# Patient Record
Sex: Female | Born: 1965
Health system: Southern US, Community
[De-identification: ages and names within clinical notes are randomized; demographics above are authoritative.]

## PROBLEM LIST (undated history)

## (undated) DIAGNOSIS — D649 Anemia, unspecified: Secondary | ICD-10-CM

## (undated) DIAGNOSIS — N8003 Adenomyosis of the uterus: Secondary | ICD-10-CM

## (undated) DIAGNOSIS — F32A Depression, unspecified: Secondary | ICD-10-CM

## (undated) DIAGNOSIS — D500 Iron deficiency anemia secondary to blood loss (chronic): Secondary | ICD-10-CM

## (undated) DIAGNOSIS — N946 Dysmenorrhea, unspecified: Secondary | ICD-10-CM

## (undated) DIAGNOSIS — I4891 Unspecified atrial fibrillation: Secondary | ICD-10-CM

## (undated) DIAGNOSIS — Z5189 Encounter for other specified aftercare: Secondary | ICD-10-CM

## (undated) DIAGNOSIS — F411 Generalized anxiety disorder: Secondary | ICD-10-CM

## (undated) DIAGNOSIS — F419 Anxiety disorder, unspecified: Secondary | ICD-10-CM

## (undated) DIAGNOSIS — N921 Excessive and frequent menstruation with irregular cycle: Secondary | ICD-10-CM

## (undated) DIAGNOSIS — M1990 Unspecified osteoarthritis, unspecified site: Secondary | ICD-10-CM

## (undated) DIAGNOSIS — I10 Essential (primary) hypertension: Secondary | ICD-10-CM

## (undated) DIAGNOSIS — F329 Major depressive disorder, single episode, unspecified: Secondary | ICD-10-CM

## (undated) DIAGNOSIS — K219 Gastro-esophageal reflux disease without esophagitis: Secondary | ICD-10-CM

## (undated) DIAGNOSIS — Z973 Presence of spectacles and contact lenses: Secondary | ICD-10-CM

## (undated) DIAGNOSIS — G5139 Clonic hemifacial spasm, unspecified: Secondary | ICD-10-CM

## (undated) HISTORY — DX: Depression, unspecified: F32.A

## (undated) HISTORY — DX: Encounter for other specified aftercare: Z51.89

## (undated) HISTORY — PX: OTHER SURGICAL HISTORY: SHX169

## (undated) HISTORY — DX: Unspecified osteoarthritis, unspecified site: M19.90

## (undated) HISTORY — DX: Anxiety disorder, unspecified: F41.9

## (undated) HISTORY — DX: Essential (primary) hypertension: I10

---

## 2004-02-06 HISTORY — PX: DILATION AND EVACUATION: SHX1459

## 2008-02-06 DIAGNOSIS — Z8679 Personal history of other diseases of the circulatory system: Secondary | ICD-10-CM

## 2008-02-06 HISTORY — DX: Personal history of other diseases of the circulatory system: Z86.79

## 2011-06-06 ENCOUNTER — Emergency Department (HOSPITAL_COMMUNITY): Payer: 59

## 2011-06-06 ENCOUNTER — Emergency Department (HOSPITAL_COMMUNITY)
Admission: EM | Admit: 2011-06-06 | Discharge: 2011-06-06 | Disposition: A | Discharge status: Home / Self Care | Payer: 59 | Attending: Emergency Medicine | Admitting: Emergency Medicine

## 2011-06-06 ENCOUNTER — Encounter (HOSPITAL_COMMUNITY): Payer: Self-pay

## 2011-06-06 DIAGNOSIS — R319 Hematuria, unspecified: Secondary | ICD-10-CM | POA: Insufficient documentation

## 2011-06-06 DIAGNOSIS — N23 Unspecified renal colic: Secondary | ICD-10-CM | POA: Insufficient documentation

## 2011-06-06 DIAGNOSIS — R109 Unspecified abdominal pain: Secondary | ICD-10-CM | POA: Insufficient documentation

## 2011-06-06 HISTORY — DX: Unspecified atrial fibrillation: I48.91

## 2011-06-06 LAB — COMPREHENSIVE METABOLIC PANEL
AST: 12 U/L (ref 0–37)
Albumin: 3.8 g/dL (ref 3.5–5.2)
Alkaline Phosphatase: 65 U/L (ref 39–117)
Chloride: 106 mEq/L (ref 96–112)
Potassium: 3.8 mEq/L (ref 3.5–5.1)
Sodium: 137 mEq/L (ref 135–145)
Total Bilirubin: 0.2 mg/dL — ABNORMAL LOW (ref 0.3–1.2)
Total Protein: 6.2 g/dL (ref 6.0–8.3)

## 2011-06-06 LAB — URINE MICROSCOPIC-ADD ON

## 2011-06-06 LAB — URINALYSIS, ROUTINE W REFLEX MICROSCOPIC
Glucose, UA: NEGATIVE mg/dL
Hgb urine dipstick: NEGATIVE
Protein, ur: NEGATIVE mg/dL
pH: 5 (ref 5.0–8.0)

## 2011-06-06 LAB — DIFFERENTIAL
Basophils Absolute: 0 10*3/uL (ref 0.0–0.1)
Basophils Relative: 0 % (ref 0–1)
Eosinophils Absolute: 0.1 10*3/uL (ref 0.0–0.7)
Neutro Abs: 5.1 10*3/uL (ref 1.7–7.7)
Neutrophils Relative %: 60 % (ref 43–77)

## 2011-06-06 LAB — CBC
Hemoglobin: 13.7 g/dL (ref 12.0–15.0)
MCH: 29.4 pg (ref 26.0–34.0)
MCHC: 34 g/dL (ref 30.0–36.0)
Platelets: 199 10*3/uL (ref 150–400)
RDW: 13.2 % (ref 11.5–15.5)

## 2011-06-06 LAB — POCT PREGNANCY, URINE: Preg Test, Ur: NEGATIVE

## 2011-06-06 MED ORDER — SODIUM CHLORIDE 0.9 % IV BOLUS (SEPSIS)
1000.0000 mL | Freq: Once | INTRAVENOUS | Status: AC
  Administered 2011-06-06: 1000 mL via INTRAVENOUS

## 2011-06-06 MED ORDER — OXYCODONE-ACETAMINOPHEN 5-325 MG PO TABS: 1.0000 | ORAL_TABLET | ORAL | Status: AC | PRN

## 2011-06-06 MED ORDER — PHENAZOPYRIDINE HCL 200 MG PO TABS: 200.0000 mg | ORAL_TABLET | Freq: Three times a day (TID) | ORAL | Status: AC

## 2011-06-06 MED ORDER — SULFAMETHOXAZOLE-TRIMETHOPRIM 800-160 MG PO TABS: 1.0000 | ORAL_TABLET | Freq: Two times a day (BID) | ORAL | Status: AC

## 2011-06-06 MED ORDER — CEFTRIAXONE SODIUM 1 G IJ SOLR
1.0000 g | Freq: Once | INTRAMUSCULAR | Status: AC
  Administered 2011-06-06: 1 g via INTRAVENOUS
  Filled 2011-06-06: qty 10

## 2011-06-06 MED ORDER — TAMSULOSIN HCL 0.4 MG PO CAPS: 0.4000 mg | ORAL_CAPSULE | Freq: Every day | ORAL | Status: DC

## 2011-06-06 MED ORDER — KETOROLAC TROMETHAMINE 30 MG/ML IJ SOLN
30.0000 mg | Freq: Once | INTRAMUSCULAR | Status: AC
  Administered 2011-06-06: 30 mg via INTRAVENOUS
  Filled 2011-06-06: qty 1

## 2011-06-06 MED ORDER — MORPHINE SULFATE 4 MG/ML IJ SOLN
4.0000 mg | Freq: Once | INTRAMUSCULAR | Status: AC
  Administered 2011-06-06: 4 mg via INTRAVENOUS
  Filled 2011-06-06: qty 1

## 2011-06-06 MED ORDER — ONDANSETRON HCL 4 MG PO TABS: 4.0000 mg | ORAL_TABLET | Freq: Four times a day (QID) | ORAL | Status: AC

## 2011-06-06 NOTE — ED Notes (Signed)
MD at bedside. 

## 2011-06-06 NOTE — ED Notes (Signed)
Patient transported to CT 

## 2011-06-06 NOTE — ED Provider Notes (Signed)
History     CSN: [id number]  Arrival date & time 06/06/11  1713   First MD Initiated Contact with Patient 06/06/11 1817      Chief Complaint  Patient presents with  . Flank Pain    (Consider location/radiation/quality/duration/timing/severity/associated sxs/prior treatment) HPI Pt with L flank pain radiating to L groin this morning with nausea, hematuria and difficulty initiating urine stream. No fever chills. +dysuria. Pain has improved significantly. + family history of renal stones Past Medical History  Diagnosis Date  . Atrial fibrillation     Past Surgical History  Procedure Date  . Mscarrigage   . Cesarean section     No family history on file.  History  Substance Use Topics  . Smoking status: Never Smoker   . Smokeless tobacco: Not on file  . Alcohol Use: No    OB History    Grav Para Term Preterm Abortions TAB SAB Ect Mult Living                  Review of Systems  Constitutional: Negative for fever and chills.  Gastrointestinal: Positive for nausea and abdominal pain. Negative for vomiting.  Genitourinary: Positive for dysuria, hematuria, flank pain and difficulty urinating.    Allergies  Review of patient's allergies indicates no known allergies.  Home Medications   Current Outpatient Rx  Name Route Sig Dispense Refill  . IBUPROFEN 200 MG PO TABS Oral Take 600 mg by mouth every 6 (six) hours as needed. For pain    . ONDANSETRON HCL 4 MG PO TABS Oral Take 1 tablet (4 mg total) by mouth every 6 (six) hours. 12 tablet 0  . OXYCODONE-ACETAMINOPHEN 5-325 MG PO TABS Oral Take 1 tablet by mouth every 4 (four) hours as needed for pain. 15 tablet 0  . PHENAZOPYRIDINE HCL 200 MG PO TABS Oral Take 200 mg by mouth 3 (three) times daily as needed.    . SULFAMETHOXAZOLE-TRIMETHOPRIM 800-160 MG PO TABS Oral Take 1 tablet by mouth 2 (two) times daily. 14 tablet 0  . TAMSULOSIN HCL 0.4 MG PO CAPS Oral Take 1 capsule (0.4 mg total) by mouth daily. 30 capsule 0     BP 113/68  Pulse 78  Temp(Src) 97.9 F (36.6 C) (Oral)  Resp 16  SpO2 96%  LMP 05/19/2011  Physical Exam  Nursing note and vitals reviewed. Constitutional: She is oriented to person, place, and time. She appears well-developed and well-nourished. No distress.  HENT:  Head: Normocephalic and atraumatic.  Mouth/Throat: Oropharynx is clear and moist.  Eyes: EOM are normal. Pupils are equal, round, and reactive to light.  Neck: Normal range of motion. Neck supple.  Cardiovascular: Normal rate and regular rhythm.   Pulmonary/Chest: Effort normal and breath sounds normal. No respiratory distress. She has no wheezes. She has no rales. She exhibits no tenderness.  Abdominal: Soft. Bowel sounds are normal. There is no tenderness. There is no rebound and no guarding.  Musculoskeletal: Normal range of motion. She exhibits no edema and no tenderness.  Neurological: She is alert and oriented to person, place, and time.  Skin: Skin is warm and dry. No rash noted. No erythema.  Psychiatric: She has a normal mood and affect. Her behavior is normal.    ED Course  Procedures (including critical care time)  Labs Reviewed  URINALYSIS, ROUTINE W REFLEX MICROSCOPIC - Abnormal; Notable for the following:    Color, Urine RED (*) BIOCHEMICALS MAY BE AFFECTED BY COLOR   Bilirubin Urine MODERATE (*)  Ketones, ur 15 (*)    Urobilinogen, UA 4.0 (*)    Nitrite POSITIVE (*)    Leukocytes, UA MODERATE (*)    All other components within normal limits  COMPREHENSIVE METABOLIC PANEL - Abnormal; Notable for the following:    Total Bilirubin 0.2 (*)    All other components within normal limits  POCT PREGNANCY, URINE  URINE MICROSCOPIC-ADD ON  CBC  DIFFERENTIAL  URINE CULTURE   Ct Abdomen Pelvis Wo Contrast  06/06/2011  *RADIOLOGY REPORT*  Clinical Data:  left flank pain  CT ABDOMEN AND PELVIS WITHOUT CONTRAST  Technique:  Multidetector CT imaging of the abdomen and pelvis was performed following the  standard protocol without intravenous contrast.  Comparison: None.  Findings: Lung bases are unremarkable.  Sagittal images of the spine are unremarkable.  Unenhanced liver is unremarkable.  No intrahepatic biliary ductal dilatation.  Unenhanced spleen, pancreas and left adrenal gland are unremarkable.  No calcified gallstones are noted within gallbladder. Small calcification is noted in the right adrenal gland probable prior hemorrhage.  No nephrolithiasis.  There is minimal prominence of the left ureter.  No proximal or mid calcified ureteral calculi are noted.  In axial image 70 there is a 4 mm calcification in the left UVJ region highly suspicious for a distal left ureteral calculus.  No significant ureteral dilatation is noted.  Limited assessment of the unenhanced uterus.  There is somewhat nodular prominence of the anterior fundal aspect on the sagittal view measures about 3.7 cm. I cannot exclude a myometrial fibroid.  Correlation with pelvic ultrasound is recommended.  No small bowel obstruction.  No ascites or free air.  No adenopathy.  Normal appendix is clearly visualized in axial image 50.  IMPRESSION:  1.  There is minimal prominence of the left ureter.  There is a 4 mm calcification in the left UVJ region highly suspicious for a distal left ureteral calculus. 2.  Limited assessment of the unenhanced uterus.  Somewhat nodular prominence of the fundus.  I cannot exclude a fundal uterine fibroid.  Correlation with pelvic ultrasound and GYN exam is recommended. 3.  Normal appendix is clearly visualized.  Original Report Authenticated By: Natasha Mead, M.D.     1. Renal colic on left side       MDM    Discussed with urology, Dr Laverle Patter. Suggest starting empiric abx and f/u as outpt. Return immediately for fever, worsening pain, uncontrolled vomiting or any concerns      Loren Racer, MD 06/06/11 2157

## 2011-06-06 NOTE — Discharge Instructions (Signed)

## 2011-06-06 NOTE — ED Notes (Signed)
Patient transported from CT 

## 2011-06-06 NOTE — ED Notes (Signed)
Pt. began with lt. Flank pain , and pain became very  Severe with nausea,  Pt. Thinks she may have had a kidney stone, has never had one before but her family has a hx of them  AFter her pain stopped she developed severe pressure when voiding and hematuria, she did start taking pyridium .  Pt. Does have a hx of UTIs

## 2011-06-08 LAB — URINE CULTURE

## 2011-10-25 ENCOUNTER — Encounter (HOSPITAL_COMMUNITY): Payer: Self-pay | Admitting: *Deleted

## 2011-10-25 ENCOUNTER — Emergency Department (INDEPENDENT_AMBULATORY_CARE_PROVIDER_SITE_OTHER)
Admission: EM | Admit: 2011-10-25 | Discharge: 2011-10-25 | Disposition: A | Discharge status: Home / Self Care | Payer: 59 | Source: Home / Self Care | Attending: Family Medicine | Admitting: Family Medicine

## 2011-10-25 DIAGNOSIS — L0291 Cutaneous abscess, unspecified: Secondary | ICD-10-CM

## 2011-10-25 DIAGNOSIS — L0390 Cellulitis, unspecified: Secondary | ICD-10-CM

## 2011-10-25 DIAGNOSIS — T148 Other injury of unspecified body region: Secondary | ICD-10-CM

## 2011-10-25 DIAGNOSIS — W57XXXA Bitten or stung by nonvenomous insect and other nonvenomous arthropods, initial encounter: Secondary | ICD-10-CM

## 2011-10-25 DIAGNOSIS — T148XXA Other injury of unspecified body region, initial encounter: Secondary | ICD-10-CM

## 2011-10-25 MED ORDER — PREDNISONE 20 MG PO TABS: ORAL_TABLET | ORAL | Status: DC

## 2011-10-25 MED ORDER — CEPHALEXIN 500 MG PO CAPS: 500.0000 mg | ORAL_CAPSULE | Freq: Three times a day (TID) | ORAL | Status: DC

## 2011-10-25 NOTE — ED Notes (Signed)
Pt reports insect bite that occurred a few days ago, bruise like area is spreading, area is warm to touch

## 2011-10-25 NOTE — ED Provider Notes (Signed)
History     CSN: [id number]  Arrival date & time 10/25/11  1612   First MD Initiated Contact with Patient 10/25/11 1625      Chief Complaint  Patient presents with  . Insect Bite    (Consider location/radiation/quality/duration/timing/severity/associated sxs/prior treatment) HPI Comments:  46 year old nondiabetic female. Here complaining of an area of bruising/redness and tenderness in left arm lateral to left elbow after an insect bite that occurred 3 days ago. Skin discoloration appears to be spreading and patient reports experiencing mild discomfort with no restriction of movement in her left elbow and left shoulder joints. Denies fever or chills or general malaise. No headaches.    Past Medical History  Diagnosis Date  . Atrial fibrillation     Past Surgical History  Procedure Date  . Mscarrigage   . Cesarean section     Family History  Problem Relation Age of Onset  . Family history unknown: Yes    History  Substance Use Topics  . Smoking status: Never Smoker   . Smokeless tobacco: Not on file  . Alcohol Use: No    OB History    Grav Para Term Preterm Abortions TAB SAB Ect Mult Living                  Review of Systems  Constitutional: Negative for fever, chills and fatigue.       10 systems reviewed and  pertinent negative and positive symptoms are as per HPI.     Respiratory: Negative for cough, chest tightness, shortness of breath, wheezing and stridor.   Gastrointestinal: Negative for nausea, vomiting and abdominal pain.  Musculoskeletal: Positive for arthralgias. Negative for joint swelling.  Skin: Positive for rash.       As per history of present illness  Neurological: Negative for dizziness and headaches.  All other systems reviewed and are negative.    Allergies  Review of patient's allergies indicates no known allergies.  Home Medications   Current Outpatient Rx  Name Route Sig Dispense Refill  . CEPHALEXIN 500 MG PO CAPS Oral Take  1 capsule (500 mg total) by mouth 3 (three) times daily. 21 capsule 0    Okay to switch to equivalent liquid form if needed ...  . PREDNISONE 20 MG PO TABS  2 tabs by mouth daily for 5 days 10 tablet 0    Okay to switch to equivalent liquid form if possib ...    BP 121/75  Pulse 80  Temp 98.1 F (36.7 C) (Oral)  Resp 14  SpO2 98%  LMP 10/11/2011  Physical Exam  Nursing note and vitals reviewed. Constitutional: She is oriented to person, place, and time. She appears well-developed and well-nourished. No distress.  HENT:  Head: Normocephalic and atraumatic.  Mouth/Throat: Oropharynx is clear and moist.  Eyes: Conjunctivae normal are normal.  Cardiovascular: Normal rate, regular rhythm and normal heart sounds.   Pulmonary/Chest: Breath sounds normal. She has no wheezes.  Musculoskeletal:       Left elbow with no obvious swelling or increased temperature. No erythema. Full range of motion. Reported minimal discomfort with range of motion. Left shoulder. No redness or swelling. Full range of motion. Entire left upper extremity appears neurovascularly intact.  Lymphadenopathy:    She has no cervical adenopathy.  Neurological: She is alert and oriented to person, place, and time.  Skin:       There is an area of increased skin pigmentation in a concentric pattern of about 4 cm in  diameter. With central subcutaneous papule. Located close to left elbow lateral epicondyle. No skin swelling. Area is mildly tender to palpation. No increased temperature.    ED Course  Procedures (including critical care time)  Labs Reviewed - No data to display No results found.   1. Insect bite   2. Cellulitis       MDM  Immune reaction versus cellulitis as a consequence of the insect bite. Prescribe prednisone and cephalexin. Supportive care discussed with patient asked to return in 48-72 hours for recheck or return earlier if worsening symptoms despite following treatment.        Sharin Grave, MD 10/26/11 1210

## 2018-12-16 ENCOUNTER — Other Ambulatory Visit: Payer: Self-pay

## 2018-12-16 ENCOUNTER — Encounter (HOSPITAL_COMMUNITY): Payer: Self-pay

## 2018-12-16 ENCOUNTER — Ambulatory Visit (HOSPITAL_COMMUNITY)
Admission: EM | Admit: 2018-12-16 | Discharge: 2018-12-16 | Disposition: A | Discharge status: Home / Self Care | Payer: 59 | Attending: Family Medicine | Admitting: Family Medicine

## 2018-12-16 DIAGNOSIS — S91311A Laceration without foreign body, right foot, initial encounter: Secondary | ICD-10-CM | POA: Diagnosis not present

## 2018-12-16 DIAGNOSIS — W228XXA Striking against or struck by other objects, initial encounter: Secondary | ICD-10-CM | POA: Diagnosis not present

## 2018-12-16 NOTE — Discharge Instructions (Addendum)
Keep area clean and reasonably dry.  Watch for infection.  Return as needed

## 2018-12-16 NOTE — ED Triage Notes (Signed)
Patient presents to Urgent Care with complaints of right foot laceration since this morning. Patient reports she stepped on one of her daughter's arrowheads (the razor blade kind).  Pt reports tetanus approx 5 years ago.

## 2018-12-16 NOTE — ED Provider Notes (Signed)
[facility]    CSN: [id number] Arrival date & time: 12/16/18  0945      History   Chief Complaint Chief Complaint  Patient presents with  . Extremity Laceration    HPI Aimee Maldonado is a 53 y.o. female.   HPI  Patient states her daughter does Community education officer.  She is considering both hunting.  She dropped one of her arrowhead's that has razors around the edge.  Mother accidentally stepped on this this morning.  Has a laceration on her right foot.  Bleeding stopped with pressure.  Past Medical History:  Diagnosis Date  . Atrial fibrillation     There are no active problems to display for this patient.   Past Surgical History:  Procedure Laterality Date  . CESAREAN SECTION    . mscarrigage      OB History   No obstetric history on file.      Home Medications    Prior to Admission medications   Medication Sig Start Date End Date Taking? Authorizing Provider  buPROPion (WELLBUTRIN) 100 MG tablet Take 100 mg by mouth daily.   Yes [provider]  escitalopram (LEXAPRO) 20 MG tablet Take 20 mg by mouth daily.   Yes [provider]  cephALEXin (KEFLEX) 500 MG capsule Take 1 capsule (500 mg total) by mouth 3 (three) times daily. 10/25/11   Moreno-Coll, Adlih, MD  predniSONE (DELTASONE) 20 MG tablet 2 tabs by mouth daily for 5 days 10/25/11   Moreno-Coll, Adlih, MD    Family History Family History  Problem Relation Age of Onset  . Diabetes Mother   . Hypertension Mother   . Diabetes Father     Social History Social History   Tobacco Use  . Smoking status: Never Smoker  . Smokeless tobacco: Never Used  Substance Use Topics  . Alcohol use: No  . Drug use: No     Allergies   Patient has no known allergies.   Review of Systems Review of Systems  Constitutional: Negative for chills and fever.  HENT: Negative for ear pain and sore throat.   Eyes: Negative for pain and visual disturbance.  Respiratory: Negative for cough and  shortness of breath.   Cardiovascular: Negative for chest pain and palpitations.  Gastrointestinal: Negative for abdominal pain and vomiting.  Genitourinary: Negative for dysuria and hematuria.  Musculoskeletal: Negative for arthralgias and back pain.  Skin: Positive for wound. Negative for color change and rash.  Neurological: Negative for seizures and syncope.  All other systems reviewed and are negative.    Physical Exam Triage Vital Signs ED Triage Vitals  Enc Vitals Group     BP 12/16/18 1041 (!) 155/80     Pulse Rate 12/16/18 1041 87     Resp 12/16/18 1041 15     Temp 12/16/18 1041 98.1 F (36.7 C)     Temp Source 12/16/18 1041 Oral     SpO2 12/16/18 1041 100 %     Weight --      Height --      Head Circumference --      Peak Flow --      Pain Score 12/16/18 1037 1     Pain Loc --      Pain Edu? --      Excl. in [place]? --    No data found.  Updated Vital Signs BP (!) 155/80 (BP Location: Right Arm)   Pulse 87   Temp 98.1 F (36.7 C) (Oral)  Resp 15   SpO2 100%      Physical Exam Constitutional:      General: She is not in acute distress.    Appearance: She is well-developed and normal weight.  HENT:     Head: Normocephalic and atraumatic.  Eyes:     Conjunctiva/sclera: Conjunctivae normal.     Pupils: Pupils are equal, round, and reactive to light.  Neck:     Musculoskeletal: Normal range of motion.  Cardiovascular:     Rate and Rhythm: Normal rate.  Pulmonary:     Effort: Pulmonary effort is normal. No respiratory distress.  Abdominal:     General: There is no distension.     Palpations: Abdomen is soft.  Musculoskeletal: Normal range of motion.  Skin:    General: Skin is warm and dry.     Comments: His laceration on the medial aspect of the right foot over the first MTP joint.  3 cm long, 2 to 3 mm deep.  Bleeding controlled with pressure.  Closed with Dermabond  Neurological:     General: No focal deficit present.     Mental Status: She is  alert.  Psychiatric:        Mood and Affect: Mood normal.     Comments: Pleasant.  No distress.  Cooperative      UC Treatments / Results  Labs (all labs ordered are listed, but only abnormal results are displayed) Labs Reviewed - No data to display  EKG   Radiology No results found.  Procedures Procedures (including critical care time)  Medications Ordered in UC Medications - No data to display  Initial Impression / Assessment and Plan / UC Course  I have reviewed the triage vital signs and the nursing notes.  Pertinent labs & imaging results that were available during my care of the patient were reviewed by me and considered in my medical decision making (see chart for details).     Wound care reviewed Final Clinical Impressions(s) / UC Diagnoses   Final diagnoses:  None     Discharge Instructions     Keep area clean and reasonably dry.  Watch for infection.  Return as needed   ED Prescriptions    None     PDMP not reviewed this encounter.   Eustace Moore, MD 12/16/18 1131

## 2019-06-02 ENCOUNTER — Other Ambulatory Visit: Payer: Self-pay | Admitting: Family Medicine

## 2019-06-02 DIAGNOSIS — Z1231 Encounter for screening mammogram for malignant neoplasm of breast: Secondary | ICD-10-CM

## 2019-06-05 ENCOUNTER — Ambulatory Visit: Payer: 59

## 2020-01-13 ENCOUNTER — Ambulatory Visit: Payer: 59 | Admitting: Physician Assistant

## 2020-01-15 ENCOUNTER — Ambulatory Visit: Payer: 59 | Admitting: Physician Assistant

## 2020-01-19 ENCOUNTER — Other Ambulatory Visit: Payer: Self-pay

## 2020-01-19 ENCOUNTER — Encounter: Payer: Self-pay | Admitting: Physician Assistant

## 2020-01-19 ENCOUNTER — Ambulatory Visit (INDEPENDENT_AMBULATORY_CARE_PROVIDER_SITE_OTHER): Payer: 59 | Admitting: Physician Assistant

## 2020-01-19 VITALS — BP 142/92 | HR 113 | Temp 98.4°F | Resp 14 | Ht 61.0 in | Wt 144.0 lb

## 2020-01-19 DIAGNOSIS — I10 Essential (primary) hypertension: Secondary | ICD-10-CM | POA: Diagnosis not present

## 2020-01-19 DIAGNOSIS — Z8679 Personal history of other diseases of the circulatory system: Secondary | ICD-10-CM | POA: Diagnosis not present

## 2020-01-19 MED ORDER — METOPROLOL SUCCINATE ER 25 MG PO TB24: 25.0000 mg | ORAL_TABLET | Freq: Every day | ORAL | 3 refills | Status: DC

## 2020-01-19 NOTE — Patient Instructions (Signed)
Please go to the lab today for blood work.  I will call you with your results. We will alter treatment regimen(s) if indicated by your results.   Please start the Toprol XL daily. I am setting you up with Cardiology for stress testing giving history of a fib and EKG today.   Follow low-salt diet noted below. Check BP at home and record daily. Bring to follow-up in 2 weeks for further adjustments.   I am setting you up with GYN.

## 2020-01-19 NOTE — Progress Notes (Signed)
Patient presents to clinic today to establish care. Was recently diagnosed with Hypertension and started on medication.  Endorses taking as directed and tolerating well but still having significant elevation in blood pressure with home BP averaging 160-170/100 and slight elevated heart rate. Patient denies chest pain, palpitations, lightheadedness, dizziness, vision changes or frequent headaches.   BP Readings from Last 3 Encounters:  01/19/20 (!) 142/92  12/16/18 (!) 155/80  10/25/11 121/75    Chronic Issues: Anxiety and Depression -- Followed by Psychiatry.  Is currently on a longstanding regimen of Wellbutrin 150 mg and Cymbalta 60 mg daily.  Endorses taking medications as directed and tolerating well with stable mood.Marland Kitchen   Health Maintenance: Immunizations --declines flu shot. Colonoscopy -- Overdue.  Declines discussion today..  Mammogram -- Overdue.  Declines discussion today.Marland Kitchen  PAP -- Overdue.  Past Medical History:  Diagnosis Date   Anxiety    Arthritis    Atrial fibrillation    Blood transfusion without reported diagnosis    Depression    Hypertension     Past Surgical History:  Procedure Laterality Date   CESAREAN SECTION  05/2009   mscarrigage      Current Outpatient Medications on File Prior to Visit  Medication Sig Dispense Refill   buPROPion (WELLBUTRIN XL) 150 MG 24 hr tablet Take 150 mg by mouth at bedtime.     DULoxetine (CYMBALTA) 60 MG capsule Take 60 mg by mouth daily.     hydrochlorothiazide (HYDRODIURIL) 12.5 MG tablet Take 12.5 mg by mouth daily.     No current facility-administered medications on file prior to visit.    No Known Allergies  Family History  Problem Relation Age of Onset   Diabetes Mother    Hypertension Mother    Anxiety disorder Mother    Miscarriages / [place] Mother    Diabetes Father    Hyperlipidemia Brother    Cerebral palsy Brother    Anxiety disorder Daughter    Depression Daughter     Anxiety disorder Son     Social History   Socioeconomic History   Marital status: Married    Spouse name: Not on file   Number of children: Not on file   Years of education: Not on file   Highest education level: Not on file  Occupational History   Not on file  Tobacco Use   Smoking status: Never Smoker   Smokeless tobacco: Never Used  Vaping Use   Vaping Use: Never used  Substance and Sexual Activity   Alcohol use: No   Drug use: No   Sexual activity: Yes    Birth control/protection: None  Other Topics Concern   Not on file  Social History Narrative   Not on file   Social Determinants of Health   Financial Resource Strain: Not on file  Food Insecurity: Not on file  Transportation Needs: Not on file  Physical Activity: Not on file  Stress: Not on file  Social Connections: Not on file  Intimate Partner Violence: Not on file   ROS Pertinent ROS are listed in the HPI.   Resp 14    Ht _0  (1.549 m)    Wt 144 lb (65.3 kg)    BMI 27.21 kg/m   Physical Exam Vitals reviewed.  Constitutional:      Appearance: Normal appearance.  HENT:     Head: Normocephalic and atraumatic.     Right Ear: Tympanic membrane normal.     Left Ear: Tympanic membrane normal.  Cardiovascular:     Rate and Rhythm: Normal rate and regular rhythm.     Pulses: Normal pulses.     Heart sounds: Normal heart sounds.  Pulmonary:     Effort: Pulmonary effort is normal.     Breath sounds: Normal breath sounds.  Neurological:     General: No focal deficit present.     Mental Status: She is alert and oriented to person, place, and time.  Psychiatric:        Mood and Affect: Mood normal.      Assessment/Plan: 1. History of atrial fibrillation 2. Primary hypertension Patient endorses more recent diagnosis of hypertension however on EMR review since she has elevated blood pressure in previous years.  Is on hydrochlorothiazide with continued elevation in blood pressure.  At the  end of the visit she does mention history of A. fib but has not had issue or been on medication in several years.  Unsure of the validity of this.  Will obtain records.  She is in sinus tachycardia today on examination and as evidenced by EKG.  We will add on Toprol-XL 25 mg daily.  Start DASH diet.  We will also check CMP and TSH levels today.  Plan to set patient up with cardiology for further evaluation given her history and some changes (t wave abnormalities) on EKG today. - EKG 12-Lead - TSH - metoprolol succinate (TOPROL-XL) 25 MG 24 hr tablet; Take 1 tablet (25 mg total) by mouth daily.  Dispense: 30 tablet; Refill: 3 - Comp Met (CMET)  This visit occurred during the SARS-CoV-2 public health emergency.  Safety protocols were in place, including screening questions prior to the visit, additional usage of staff PPE, and extensive cleaning of exam room while observing appropriate contact time as indicated for disinfecting solutions.     Leeanne Rio, PA-C

## 2020-01-20 LAB — TSH: TSH: 1.86 u[IU]/mL (ref 0.35–4.50)

## 2020-01-20 LAB — COMPREHENSIVE METABOLIC PANEL
ALT: 17 U/L (ref 0–35)
AST: 13 U/L (ref 0–37)
Albumin: 4.4 g/dL (ref 3.5–5.2)
Alkaline Phosphatase: 103 U/L (ref 39–117)
BUN: 11 mg/dL (ref 6–23)
CO2: 31 mEq/L (ref 19–32)
Calcium: 9.5 mg/dL (ref 8.4–10.5)
Chloride: 99 mEq/L (ref 96–112)
Creatinine, Ser: 0.79 mg/dL (ref 0.40–1.20)
GFR: 86.15 mL/min (ref >60.00)
Glucose, Bld: 94 mg/dL (ref 70–99)
Potassium: 3.9 mEq/L (ref 3.5–5.1)
Sodium: 140 mEq/L (ref 135–145)
Total Bilirubin: 0.3 mg/dL (ref 0.2–1.2)
Total Protein: 7 g/dL (ref 6.0–8.3)

## 2020-02-02 ENCOUNTER — Other Ambulatory Visit: Payer: Self-pay

## 2020-02-02 ENCOUNTER — Encounter: Payer: Self-pay | Admitting: Physician Assistant

## 2020-02-02 ENCOUNTER — Ambulatory Visit: Payer: 59 | Admitting: Physician Assistant

## 2020-02-02 VITALS — BP 122/82 | HR 91 | Temp 98.3°F | Resp 14 | Ht 61.0 in | Wt 144.0 lb

## 2020-02-02 DIAGNOSIS — I10 Essential (primary) hypertension: Secondary | ICD-10-CM | POA: Diagnosis not present

## 2020-02-02 NOTE — Progress Notes (Signed)
Patient presents to clinic today for follow-up of hypertension. At last visit BP above goal on her HCTZ daily. Giving reported history of elevated resting heart rate and prior episode of atrial fibrillation many years ago with occasional episodes of palpitations, Metoprolol XL 25 mg daily was added to regimen. Patient was also referred to Cardiology giving history for further evaluation and holter study. Has appt scheduled for 02/12/2020. Endorses taking both medications as directed and tolerating well overall. Some occasional morning fatigue that she thinks may be from Metoprolol but is improving. Patient denies chest pain, palpitations, lightheadedness, dizziness, vision changes or frequent headaches.  BP Readings from Last 3 Encounters:  02/02/20 122/82  01/19/20 (!) 142/92  12/16/18 (!) 155/80   .   Past Medical History:  Diagnosis Date  . Anxiety   . Arthritis   . Atrial fibrillation   . Blood transfusion without reported diagnosis   . Depression   . Hypertension     Current Outpatient Medications on File Prior to Visit  Medication Sig Dispense Refill  . buPROPion (WELLBUTRIN XL) 150 MG 24 hr tablet Take 150 mg by mouth at bedtime.    . DULoxetine (CYMBALTA) 60 MG capsule Take 60 mg by mouth daily.    . hydrochlorothiazide (HYDRODIURIL) 12.5 MG tablet Take 12.5 mg by mouth daily.    . metoprolol succinate (TOPROL-XL) 25 MG 24 hr tablet Take 1 tablet (25 mg total) by mouth daily. 30 tablet 3   No current facility-administered medications on file prior to visit.    No Known Allergies  Family History  Problem Relation Age of Onset  . Diabetes Mother   . Hypertension Mother   . Anxiety disorder Mother   . Miscarriages / [place] Mother   . Diabetes Father   . Hyperlipidemia Brother   . Cerebral palsy Brother   . Anxiety disorder Daughter   . Depression Daughter   . Anxiety disorder Son     Social History   Socioeconomic History  . Marital status: Married     Spouse name: Not on file  . Number of children: Not on file  . Years of education: Not on file  . Highest education level: Not on file  Occupational History  . Not on file  Tobacco Use  . Smoking status: Never Smoker  . Smokeless tobacco: Never Used  Vaping Use  . Vaping Use: Never used  Substance and Sexual Activity  . Alcohol use: No  . Drug use: No  . Sexual activity: Yes    Birth control/protection: None  Other Topics Concern  . Not on file  Social History Narrative  . Not on file   Social Determinants of Health   Financial Resource Strain: Not on file  Food Insecurity: Not on file  Transportation Needs: Not on file  Physical Activity: Not on file  Stress: Not on file  Social Connections: Not on file   Review of Systems - See HPI.  All other ROS are negative.  BP 122/82   Pulse 91   Temp 98.3 F (36.8 C) (Temporal)   Resp 14   Ht '5\' 1"'  (1.549 m)   Wt 144 lb (65.3 kg)   SpO2 97%   BMI 27.21 kg/m   Physical Exam Vitals reviewed.  Constitutional:      Appearance: Normal appearance.  HENT:     Head: Normocephalic and atraumatic.  Cardiovascular:     Rate and Rhythm: Normal rate and regular rhythm.     Pulses:  Normal pulses.     Heart sounds: Normal heart sounds.  Pulmonary:     Effort: Pulmonary effort is normal.     Breath sounds: Normal breath sounds.  Musculoskeletal:     Cervical back: Neck supple.  Neurological:     General: No focal deficit present.     Mental Status: She is alert.  Psychiatric:        Mood and Affect: Mood normal.    TSH     Status: None   Collection Time: 01/19/20  1:46 PM  Result Value Ref Range   TSH 1.86 0.35 - 4.50 uIU/mL  Comp Met (CMET)     Status: None   Collection Time: 01/19/20  1:46 PM  Result Value Ref Range   Sodium 140 135 - 145 mEq/L   Potassium 3.9 3.5 - 5.1 mEq/L   Chloride 99 96 - 112 mEq/L   CO2 31 19 - 32 mEq/L   Glucose, Bld 94 70 - 99 mg/dL   BUN 11 6 - 23 mg/dL   Creatinine, Ser 0.79 0.40 -  1.20 mg/dL   Total Bilirubin 0.3 0.2 - 1.2 mg/dL   Alkaline Phosphatase 103 39 - 117 U/L   AST 13 0 - 37 U/L   ALT 17 0 - 35 U/L   Total Protein 7.0 6.0 - 8.3 g/dL   Albumin 4.4 3.5 - 5.2 g/dL   GFR 86.15 >60.00 mL/min    Comment: Calculated using the CKD-EPI Creatinine Equation (2021)   Calcium 9.5 8.4 - 10.5 mg/dL    Assessment/Plan: 1. Primary hypertension BP normotensive range. Asymptomatic. Continue current medication regimen. Continue DASH diet. Follow-up with Cardiology as scheduled.   CPE at earliest convenience.   This visit occurred during the SARS-CoV-2 public health emergency.  Safety protocols were in place, including screening questions prior to the visit, additional usage of staff PPE, and extensive cleaning of exam room while observing appropriate contact time as indicated for disinfecting solutions.     Leeanne Rio, PA-C

## 2020-02-02 NOTE — Patient Instructions (Signed)
Please keep hydrated and get plenty of rest.  Continue current medication regimen, following up with Cardiology as scheduled.   Continue following dietary recommendations below. Consider starting daily sublingual B12 supplement OTC.    DASH Eating Plan DASH stands for "Dietary Approaches to Stop Hypertension." The DASH eating plan is a healthy eating plan that has been shown to reduce high blood pressure (hypertension). It may also reduce your risk for type 2 diabetes, heart disease, and stroke. The DASH eating plan may also help with weight loss. What are tips for following this plan?  General guidelines  Avoid eating more than 2,300 mg (milligrams) of salt (sodium) a day. If you have hypertension, you may need to reduce your sodium intake to 1,500 mg a day.  Limit alcohol intake to no more than 1 drink a day for nonpregnant women and 2 drinks a day for men. One drink equals 12 oz of beer, 5 oz of wine, or 1 oz of hard liquor.  Work with your health care provider to maintain a healthy body weight or to lose weight. Ask what an ideal weight is for you.  Get at least 30 minutes of exercise that causes your heart to beat faster (aerobic exercise) most days of the week. Activities may include walking, swimming, or biking.  Work with your health care provider or diet and nutrition specialist (dietitian) to adjust your eating plan to your individual calorie needs. Reading food labels   Check food labels for the amount of sodium per serving. Choose foods with less than 5 percent of the Daily Value of sodium. Generally, foods with less than 300 mg of sodium per serving fit into this eating plan.  To find whole grains, look for the word "whole" as the first word in the ingredient list. Shopping  Buy products labeled as "low-sodium" or "no salt added."  Buy fresh foods. Avoid canned foods and premade or frozen meals. Cooking  Avoid adding salt when cooking. Use salt-free seasonings or  herbs instead of table salt or sea salt. Check with your health care provider or pharmacist before using salt substitutes.  Do not fry foods. Cook foods using healthy methods such as baking, boiling, grilling, and broiling instead.  Cook with heart-healthy oils, such as olive, canola, soybean, or sunflower oil. Meal planning  Eat a balanced diet that includes: ? 5 or more servings of fruits and vegetables each day. At each meal, try to fill half of your plate with fruits and vegetables. ? Up to 6-8 servings of whole grains each day. ? Less than 6 oz of lean meat, poultry, or fish each day. A 3-oz serving of meat is about the same size as a deck of cards. One egg equals 1 oz. ? 2 servings of low-fat dairy each day. ? A serving of nuts, seeds, or beans 5 times each week. ? Heart-healthy fats. Healthy fats called Omega-3 fatty acids are found in foods such as flaxseeds and coldwater fish, like sardines, salmon, and mackerel.  Limit how much you eat of the following: ? Canned or prepackaged foods. ? Food that is high in trans fat, such as fried foods. ? Food that is high in saturated fat, such as fatty meat. ? Sweets, desserts, sugary drinks, and other foods with added sugar. ? Full-fat dairy products.  Do not salt foods before eating.  Try to eat at least 2 vegetarian meals each week.  Eat more home-cooked food and less restaurant, buffet, and fast food.  When  eating at a restaurant, ask that your food be prepared with less salt or no salt, if possible. What foods are recommended? The items listed may not be a complete list. Talk with your dietitian about what dietary choices are best for you. Grains Whole-grain or whole-wheat bread. Whole-grain or whole-wheat pasta. Brown rice. [place] Morrow. Bulgur. Whole-grain and low-sodium cereals. Pita bread. Low-fat, low-sodium crackers. Whole-wheat flour tortillas. Vegetables Fresh or frozen vegetables (raw, steamed, roasted, or grilled).  Low-sodium or reduced-sodium tomato and vegetable juice. Low-sodium or reduced-sodium tomato sauce and tomato paste. Low-sodium or reduced-sodium canned vegetables. Fruits All fresh, dried, or frozen fruit. Canned fruit in natural juice (without added sugar). Meat and other protein foods Skinless chicken or [place]. Ground chicken or [place]. Pork with fat trimmed off. Fish and seafood. Egg whites. Dried beans, peas, or lentils. Unsalted nuts, nut butters, and seeds. Unsalted canned beans. Lean cuts of beef with fat trimmed off. Low-sodium, lean deli meat. Dairy Low-fat (1%) or fat-free (skim) milk. Fat-free, low-fat, or reduced-fat cheeses. Nonfat, low-sodium ricotta or cottage cheese. Low-fat or nonfat yogurt. Low-fat, low-sodium cheese. Fats and oils Soft margarine without trans fats. Vegetable oil. Low-fat, reduced-fat, or light mayonnaise and salad dressings (reduced-sodium). Canola, safflower, olive, soybean, and sunflower oils. Avocado. Seasoning and other foods Herbs. Spices. Seasoning mixes without salt. Unsalted popcorn and pretzels. Fat-free sweets. What foods are not recommended? The items listed may not be a complete list. Talk with your dietitian about what dietary choices are best for you. Grains Baked goods made with fat, such as croissants, muffins, or some breads. Dry pasta or rice meal packs. Vegetables Creamed or fried vegetables. Vegetables in a cheese sauce. Regular canned vegetables (not low-sodium or reduced-sodium). Regular canned tomato sauce and paste (not low-sodium or reduced-sodium). Regular tomato and vegetable juice (not low-sodium or reduced-sodium). Angie Fava. Olives. Fruits Canned fruit in a light or heavy syrup. Fried fruit. Fruit in cream or butter sauce. Meat and other protein foods Fatty cuts of meat. Ribs. Fried meat. Berniece Salines. Sausage. Bologna and other processed lunch meats. Salami. Fatback. Hotdogs. Bratwurst. Salted nuts and seeds. Canned beans with added  salt. Canned or smoked fish. Whole eggs or egg yolks. Chicken or [place] with skin. Dairy Whole or 2% milk, cream, and half-and-half. Whole or full-fat cream cheese. Whole-fat or sweetened yogurt. Full-fat cheese. Nondairy creamers. Whipped toppings. Processed cheese and cheese spreads. Fats and oils Butter. Stick margarine. Lard. Shortening. Ghee. Bacon fat. Tropical oils, such as coconut, palm kernel, or palm oil. Seasoning and other foods Salted popcorn and pretzels. Onion salt, garlic salt, seasoned salt, table salt, and sea salt. Worcestershire sauce. Tartar sauce. Barbecue sauce. Teriyaki sauce. Soy sauce, including reduced-sodium. Steak sauce. Canned and packaged gravies. Fish sauce. Oyster sauce. Cocktail sauce. Horseradish that you find on the shelf. Ketchup. Mustard. Meat flavorings and tenderizers. Bouillon cubes. Hot sauce and Tabasco sauce. Premade or packaged marinades. Premade or packaged taco seasonings. Relishes. Regular salad dressings. Where to find more information:  National Heart, Lung, and [place]: https://wilson-eaton.com/  American Heart Association: www.heart.org Summary  The DASH eating plan is a healthy eating plan that has been shown to reduce high blood pressure (hypertension). It may also reduce your risk for type 2 diabetes, heart disease, and stroke.  With the DASH eating plan, you should limit salt (sodium) intake to 2,300 mg a day. If you have hypertension, you may need to reduce your sodium intake to 1,500 mg a day.  When on the DASH eating plan, aim  to eat more fresh fruits and vegetables, whole grains, lean proteins, low-fat dairy, and heart-healthy fats.  Work with your health care provider or diet and nutrition specialist (dietitian) to adjust your eating plan to your individual calorie needs. This information is not intended to replace advice given to you by your health care provider. Make sure you discuss any questions you have with your health care  provider. Document Revised: 01/04/2017 Document Reviewed: 01/16/2016 Elsevier Patient Education  2020 [employer].

## 2020-02-08 NOTE — Progress Notes (Signed)
Cardiology Office Note:    Date:  02/12/2020   ID:  Kess Mcilwain, DOB Nov 26, 1965, MRN [id number]  PCP:  Brunetta Jeans, PA-C  Cardiologist:  No primary care provider on file.  Electrophysiologist:  None   Referring MD: Brunetta Jeans, PA-C   Chief Complaint  Patient presents with  . Atrial Fibrillation   History of Present Illness:    Aimee Maldonado is a 55 y.o. female with a hx of atrial fibrillation, hypertension who is referred by Raiford Noble, PA for evaluation of atrial fibrillation.  She reports she first started having issues with palpitations in her 40s.  She wore a monitor at that time but is unsure of the results.  Reports that during her second pregnancy at age 35 she was having issues with palpitations and tachycardia.  States that she was hospitalized at [facility] in [place] and thinks that she was told she had atrial fibrillation.  Reports palpitations resolved with delivery.  Recently has been having issues with high blood pressure and palpitations.  Was started on metoprolol with resolution of her palpitations.  Was also started on HCTZ 12.5 mg daily at urgent care, but reports she ran out of this medication 2 days ago.  States that BP was still running 140s to 160s over 90s to 100s on HCTZ 12.5 mg daily.  She reports the palpitations she was having occurred about twice per week, can last for hours.  Felt like heart was racing.  She denies any chest pain, dyspnea, or lower extremity edema.  She reports she has had some episodes of lightheadedness when her blood pressure is high, but denies any syncopal episodes.  No smoking history.  Family history includes mother has atrial fibrillation.    Past Medical History:  Diagnosis Date  . Anxiety   . Arthritis   . Atrial fibrillation   . Blood transfusion without reported diagnosis   . Depression   . Hypertension     Past Surgical History:  Procedure Laterality Date  . CESAREAN SECTION  05/2009  .  mscarrigage      Current Medications: Current Meds  Medication Sig  . buPROPion (WELLBUTRIN XL) 150 MG 24 hr tablet Take 150 mg by mouth at bedtime.  . DULoxetine (CYMBALTA) 60 MG capsule Take 60 mg by mouth daily.  . metoprolol succinate (TOPROL-XL) 25 MG 24 hr tablet Take 1 tablet (25 mg total) by mouth daily.  . [DISCONTINUED] hydrochlorothiazide (HYDRODIURIL) 12.5 MG tablet Take 12.5 mg by mouth daily.     Allergies:   Patient has no known allergies.   Social History   Socioeconomic History  . Marital status: Married    Spouse name: Not on file  . Number of children: Not on file  . Years of education: Not on file  . Highest education level: Not on file  Occupational History  . Not on file  Tobacco Use  . Smoking status: Never Smoker  . Smokeless tobacco: Never Used  Vaping Use  . Vaping Use: Never used  Substance and Sexual Activity  . Alcohol use: No  . Drug use: No  . Sexual activity: Yes    Birth control/protection: None  Other Topics Concern  . Not on file  Social History Narrative  . Not on file   Social Determinants of Health   Financial Resource Strain: Not on file  Food Insecurity: Not on file  Transportation Needs: Not on file  Physical Activity: Not on file  Stress: Not  on file  Social Connections: Not on file     Family History: The patient's family history includes Anxiety disorder in her daughter, mother, and son; Cerebral palsy in her brother; Depression in her daughter; Diabetes in her father and mother; Hyperlipidemia in her brother; Hypertension in her mother; Miscarriages / [place] in her mother.  ROS:   Please see the history of present illness.     All other systems reviewed and are negative.  EKGs/Labs/Other Studies Reviewed:    The following studies were reviewed today:   EKG:  EKG is  ordered today.  The ekg ordered today demonstrates normal sinus rhythm, less than 1 mm ST depression in leads III, aVF, V5/6, rate 87  Recent  Labs: 01/19/2020: ALT 17; BUN 11; Creatinine, Ser 0.79; Potassium 3.9; Sodium 140; TSH 1.86  Recent Lipid Panel No results found for: CHOL, TRIG, HDL, CHOLHDL, VLDL, LDLCALC, LDLDIRECT  Physical Exam:    VS:  BP (!) 164/96 (BP Location: Left Arm)   Pulse 87   Ht 5' (1.524 m)   Wt 150 lb (68 kg)   BMI 29.29 kg/m     Wt Readings from Last 3 Encounters:  02/12/20 150 lb (68 kg)  02/02/20 144 lb (65.3 kg)  01/19/20 144 lb (65.3 kg)     GEN:  Well nourished, well developed in no acute distress HEENT: Normal NECK: No JVD; No carotid bruits LYMPHATICS: No lymphadenopathy CARDIAC: RRR, no murmurs, rubs, gallops RESPIRATORY:  Clear to auscultation without rales, wheezing or rhonchi  ABDOMEN: Soft, non-tender, non-distended MUSCULOSKELETAL:  No edema; No deformity  SKIN: Warm and dry NEUROLOGIC:  Alert and oriented x 3 PSYCHIATRIC:  Normal affect   ASSESSMENT:    1. Atrial fibrillation, unspecified type (HCC)   2. Medication management   3. Essential hypertension    PLAN:    Atrial fibrillation: Reported history, but unclear diagnosis.  Will obtain records from [facility] when A. fib was diagnosed in 2010/2011.  CHA2DS2-VASc score 2 (hypertension, female), will hold off on starting anticoagulation until A. fib confirmed.  Will check Zio patch x2 weeks.  Will check echocardiogram to evaluate for structural heart disease.  Continue Toprol-XL 25 mg daily.  Hypertension: On Toprol-XL 25 mg daily and recently started hydrochlorothiazide 12.5 mg daily but ran out 2 days ago.  Will refill HCTZ, increase dose to 25 mg daily.  Check BMP/magnesium in 1 week.  Asked patient to monitor BP daily for next 2 weeks and call with results.  RTC in 3 months   Medication Adjustments/Labs and Tests Ordered: Current medicines are reviewed at length with the patient today.  Concerns regarding medicines are outlined above.  Orders Placed This Encounter  Procedures  . Basic metabolic panel   . Magnesium  . LONG TERM MONITOR (3-14 DAYS)  . EKG 12-Lead  . ECHOCARDIOGRAM COMPLETE   Meds ordered this encounter  Medications  . hydrochlorothiazide (HYDRODIURIL) 25 MG tablet    Sig: Take 1 tablet (25 mg total) by mouth daily.    Dispense:  90 tablet    Refill:  3    Patient Instructions  Medication Instructions:  INCREASE HCTZ to 25 mg daily  *If you need a refill on your cardiac medications before your next appointment, please call your pharmacy*   Lab Work: RETURN IN 1 WEEK (BMET, Mag)  If you have labs (blood work) drawn today and your tests are completely normal, you will receive your results only by: Marland Kitchen MyChart Message (if you  have MyChart) OR . A paper copy in the mail If you have any lab test that is abnormal or we need to change your treatment, we will call you to review the results.   Testing/Procedures: Your physician has requested that you have an echocardiogram. Echocardiography is a painless test that uses sound waves to create images of your heart. It provides your doctor with information about the size and shape of your heart and how well your heart's chambers and valves are working. This procedure takes approximately one hour. There are no restrictions for this procedure.  This will be done at our [facility] location:  [address]   ZIO XT- Long Term Monitor Instructions   Your physician has requested you wear your ZIO patch monitor 14 days.   This is a single patch monitor.  [employer] supplies one patch monitor per enrollment.  Additional stickers are not available.   Please do not apply patch if you will be having a Nuclear Stress Test, Echocardiogram, Cardiac CT, MRI, or Chest Xray during the time frame you would be wearing the monitor. The patch cannot be worn during these tests.  You cannot remove and re-apply the ZIO XT patch monitor.   Your ZIO patch monitor will be sent USPS Priority mail from [facility] directly  to your home address. The monitor may also be mailed to a PO BOX if home delivery is not available.   It may take 3-5 days to receive your monitor after you have been enrolled.   Once you have received you monitor, please review enclosed instructions.  Your monitor has already been registered assigning a specific monitor serial # to you.   Applying the monitor   Shave hair from upper left chest.   Hold abrader disc by orange tab.  Rub abrader in 40 strokes over left upper chest as indicated in your monitor instructions.   Clean area with 4 enclosed alcohol pads .  Use all pads to assure are is cleaned thoroughly.  Let dry.   Apply patch as indicated in monitor instructions.  Patch will be place under collarbone on left side of chest with arrow pointing upward.   Rub patch adhesive wings for 2 minutes.Remove white label marked "1".  Remove white label marked "2".  Rub patch adhesive wings for 2 additional minutes.   While looking in a mirror, press and release button in center of patch.  A small green light will flash 3-4 times .  This will be your only indicator the monitor has been turned on.     Do not shower for the first 24 hours.  You may shower after the first 24 hours.   Press button if you feel a symptom. You will hear a small click.  Record Date, Time and Symptom in the Patient Log Book.   When you are ready to remove patch, follow instructions on last 2 pages of Patient Log Book.  Stick patch monitor onto last page of Patient Log Book.   Place Patient Log Book in [place] box.  Use locking tab on box and tape box closed securely.  The [employer] and [employer] has [employer] on it.  Please place in mailbox as soon as possible.  Your physician should have your test results approximately 7 days after the monitor has been mailed back to [facility].   Call [facility] Customer Care at [phone] if you have questions regarding your ZIO XT patch monitor.  Call  them immediately if  you see an orange light blinking on your monitor.   If your monitor falls off in less than 4 days contact our Monitor department at [phone].  If your monitor becomes loose or falls off after 4 days call [employer] at [phone] for suggestions on securing your monitor.    Follow-Up: At [facility], you and your health needs are our priority.  As part of our continuing mission to provide you with exceptional heart care, we have created designated Provider Care Teams.  These Care Teams include your primary Cardiologist (physician) and Advanced Practice Providers (APPs -  Physician Assistants and Nurse Practitioners) who all work together to provide you with the care you need, when you need it.  We recommend signing up for the patient portal called "MyChart".  Sign up information is provided on this After Visit Summary.  MyChart is used to connect with patients for Virtual Visits (Telemedicine).  Patients are able to view lab/test results, encounter notes, upcoming appointments, etc.  Non-urgent messages can be sent to your provider as well.   To learn more about what you can do with MyChart, go to [email or web address].    Your next appointment:   3 month(s)  The format for your next appointment:   In Person  Provider:   Epifanio Lesches, MD   Other Instructions Please check your blood pressure at home daily, write it down.  Call the office or send message via Mychart with the readings in 2 weeks for Dr. Bjorn Pippin to review.       Signed, Little Ishikawa, MD  02/12/2020 6:03 PM    [facility]

## 2020-02-12 ENCOUNTER — Ambulatory Visit: Payer: 59

## 2020-02-12 ENCOUNTER — Other Ambulatory Visit: Payer: Self-pay

## 2020-02-12 ENCOUNTER — Ambulatory Visit: Payer: 59 | Admitting: Cardiology

## 2020-02-12 ENCOUNTER — Encounter: Payer: Self-pay | Admitting: Cardiology

## 2020-02-12 VITALS — BP 164/96 | HR 87 | Ht 60.0 in | Wt 150.0 lb

## 2020-02-12 DIAGNOSIS — I10 Essential (primary) hypertension: Secondary | ICD-10-CM | POA: Diagnosis not present

## 2020-02-12 DIAGNOSIS — I4891 Unspecified atrial fibrillation: Secondary | ICD-10-CM

## 2020-02-12 DIAGNOSIS — Z79899 Other long term (current) drug therapy: Secondary | ICD-10-CM

## 2020-02-12 MED ORDER — HYDROCHLOROTHIAZIDE 25 MG PO TABS: 25.0000 mg | ORAL_TABLET | Freq: Every day | ORAL | 3 refills | Status: DC

## 2020-02-12 NOTE — Patient Instructions (Signed)
Medication Instructions:  INCREASE HCTZ to 25 mg daily  *If you need a refill on your cardiac medications before your next appointment, please call your pharmacy*   Lab Work: RETURN IN 1 WEEK (BMET, Mag)  If you have labs (blood work) drawn today and your tests are completely normal, you will receive your results only by: Marland Kitchen MyChart Message (if you have MyChart) OR . A paper copy in the mail If you have any lab test that is abnormal or we need to change your treatment, we will call you to review the results.   Testing/Procedures: Your physician has requested that you have an echocardiogram. Echocardiography is a painless test that uses sound waves to create images of your heart. It provides your doctor with information about the size and shape of your heart and how well your heart's chambers and valves are working. This procedure takes approximately one hour. There are no restrictions for this procedure.  This will be done at our [facility] location:  [address]   ZIO XT- Long Term Monitor Instructions   Your physician has requested you wear your ZIO patch monitor 14 days.   This is a single patch monitor.  [employer] supplies one patch monitor per enrollment.  Additional stickers are not available.   Please do not apply patch if you will be having a Nuclear Stress Test, Echocardiogram, Cardiac CT, MRI, or Chest Xray during the time frame you would be wearing the monitor. The patch cannot be worn during these tests.  You cannot remove and re-apply the ZIO XT patch monitor.   Your ZIO patch monitor will be sent USPS Priority mail from [facility] directly to your home address. The monitor may also be mailed to a PO BOX if home delivery is not available.   It may take 3-5 days to receive your monitor after you have been enrolled.   Once you have received you monitor, please review enclosed instructions.  Your monitor has already been registered assigning a  specific monitor serial # to you.   Applying the monitor   Shave hair from upper left chest.   Hold abrader disc by orange tab.  Rub abrader in 40 strokes over left upper chest as indicated in your monitor instructions.   Clean area with 4 enclosed alcohol pads .  Use all pads to assure are is cleaned thoroughly.  Let dry.   Apply patch as indicated in monitor instructions.  Patch will be place under collarbone on left side of chest with arrow pointing upward.   Rub patch adhesive wings for 2 minutes.Remove white label marked "1".  Remove white label marked "2".  Rub patch adhesive wings for 2 additional minutes.   While looking in a mirror, press and release button in center of patch.  A small green light will flash 3-4 times .  This will be your only indicator the monitor has been turned on.     Do not shower for the first 24 hours.  You may shower after the first 24 hours.   Press button if you feel a symptom. You will hear a small click.  Record Date, Time and Symptom in the Patient Log Book.   When you are ready to remove patch, follow instructions on last 2 pages of Patient Log Book.  Stick patch monitor onto last page of Patient Log Book.   Place Patient Log Book in [employer] box.  Use locking tab on box and tape  box closed securely.  The [employer] and [employer] has [employer] on it.  Please place in mailbox as soon as possible.  Your physician should have your test results approximately 7 days after the monitor has been mailed back to [facility].   Call [facility] Customer Care at [phone] if you have questions regarding your ZIO XT patch monitor.  Call them immediately if you see an orange light blinking on your monitor.   If your monitor falls off in less than 4 days contact our Monitor department at [phone].  If your monitor becomes loose or falls off after 4 days call [employer] at [phone] for suggestions on securing your monitor.    Follow-Up: At [facility], you and your health needs are our priority.  As part of our continuing mission to provide you with exceptional heart care, we have created designated Provider Care Teams.  These Care Teams include your primary Cardiologist (physician) and Advanced Practice Providers (APPs -  Physician Assistants and Nurse Practitioners) who all work together to provide you with the care you need, when you need it.  We recommend signing up for the patient portal called "MyChart".  Sign up information is provided on this After Visit Summary.  MyChart is used to connect with patients for Virtual Visits (Telemedicine).  Patients are able to view lab/test results, encounter notes, upcoming appointments, etc.  Non-urgent messages can be sent to your provider as well.   To learn more about what you can do with MyChart, go to [email or web address].    Your next appointment:   3 month(s)  The format for your next appointment:   In Person  Provider:   Epifanio Lesches, MD   Other Instructions Please check your blood pressure at home daily, write it down.  Call the office or send message via Mychart with the readings in 2 weeks for Dr. Bjorn Pippin to review.

## 2020-02-23 ENCOUNTER — Telehealth: Payer: Self-pay | Admitting: Cardiology

## 2020-02-23 NOTE — Telephone Encounter (Signed)
Returned call to patient, she states at her office visit with Dr. Bjorn Pippin she informed him that she had a history of Afib when she was pregnant.  She also informed her PCP of this (referred for Afib).   She states she got confused and does not have a history of Afib.   She does have a history of NSRVT when she was pregnant.     She states she received monitor but wanted to clarify and make sure she still needed to wear it or how this changes her plan of care.  She denies palpitations, SOB, CP, dizziness, etc.   Records requested from [place]-not received at current, will follow up.  Routed to MD to review to see if patient still needs to wear monitor.

## 2020-02-23 NOTE — Telephone Encounter (Signed)
° ° ° °  Pt would like to speak with Dr. Bjorn Pippin about her heart monitor

## 2020-02-24 NOTE — Telephone Encounter (Signed)
Yeah let's proceed with the monitor and echo Thanks, Thayer Ohm

## 2020-03-04 NOTE — Telephone Encounter (Signed)
Left detailed message (ok per DPR) to make patient aware.  Advised to call back with any questions.

## 2020-03-08 ENCOUNTER — Other Ambulatory Visit: Payer: Self-pay

## 2020-03-08 ENCOUNTER — Ambulatory Visit (HOSPITAL_COMMUNITY): Payer: 59 | Attending: Internal Medicine

## 2020-03-08 DIAGNOSIS — I4891 Unspecified atrial fibrillation: Secondary | ICD-10-CM | POA: Insufficient documentation

## 2020-03-08 LAB — ECHOCARDIOGRAM COMPLETE
Area-P 1/2: 3.29 cm2
S' Lateral: 2.3 cm

## 2020-03-09 LAB — BASIC METABOLIC PANEL
BUN/Creatinine Ratio: 19 (ref 9–23)
BUN: 13 mg/dL (ref 6–24)
CO2: 27 mmol/L (ref 20–29)
Calcium: 9.5 mg/dL (ref 8.7–10.2)
Chloride: 97 mmol/L (ref 96–106)
Creatinine, Ser: 0.67 mg/dL (ref 0.57–1.00)
GFR calc Af Amer: 117 mL/min/{1.73_m2} (ref >59)
GFR calc non Af Amer: 101 mL/min/{1.73_m2} (ref >59)
Glucose: 62 mg/dL — ABNORMAL LOW (ref 65–99)
Potassium: 4.3 mmol/L (ref 3.5–5.2)
Sodium: 138 mmol/L (ref 134–144)

## 2020-03-09 LAB — MAGNESIUM: Magnesium: 2.1 mg/dL (ref 1.6–2.3)

## 2020-06-05 NOTE — Progress Notes (Deleted)
Cardiology Office Note:    Date:  06/05/2020   ID:  Shaughnessy Gethers, DOB 23-Sep-1965, MRN [id number]  PCP:  Waldon Merl, PA-C  Cardiologist:  No primary care provider on file.  Electrophysiologist:  None   Referring MD: Waldon Merl, PA-C   No chief complaint on file.  History of Present Illness:    Aimee Maldonado is a 55 y.o. female with a hx of atrial fibrillation, hypertension who presents for follow-up.  She was referred by Marcelline Mates, PA for evaluation of possible atrial fibrillation, initially seen on 02/12/2020.  She reports she first started having issues with palpitations in her 34s.  She wore a monitor at that time but is unsure of the results.  Reports that during her second pregnancy at age 16 she was having issues with palpitations and tachycardia.  States that she was hospitalized at [facility] in [place] and thinks that she was told she had atrial fibrillation.  Reports palpitations resolved with delivery.  Recently has been having issues with high blood pressure and palpitations.  Was started on metoprolol with resolution of her palpitations.  Was also started on HCTZ 12.5 mg daily at urgent care, but reports she ran out of this medication 2 days ago.  States that BP was still running 140s to 160s over 90s to 100s on HCTZ 12.5 mg daily.  She reports the palpitations she was having occurred about twice per week, can last for hours.  Felt like heart was racing.  She denies any chest pain, dyspnea, or lower extremity edema.  She reports she has had some episodes of lightheadedness when her blood pressure is high, but denies any syncopal episodes.  No smoking history.  Family history includes mother has atrial fibrillation.  Echocardiogram on 03/08/2020 showed normal biventricular function, no significant valvular disease, possible small PFO.  Cardiac monitor was ordered but has not been done.  Since last clinic visit,    Past Medical History:  Diagnosis Date   . Anxiety   . Arthritis   . Atrial fibrillation   . Blood transfusion without reported diagnosis   . Depression   . Hypertension     Past Surgical History:  Procedure Laterality Date  . CESAREAN SECTION  05/2009  . mscarrigage      Current Medications: No outpatient medications have been marked as taking for the 06/06/20 encounter (Appointment) with Little Ishikawa, MD.     Allergies:   Patient has no known allergies.   Social History   Socioeconomic History  . Marital status: Married    Spouse name: Not on file  . Number of children: Not on file  . Years of education: Not on file  . Highest education level: Not on file  Occupational History  . Not on file  Tobacco Use  . Smoking status: Never Smoker  . Smokeless tobacco: Never Used  Vaping Use  . Vaping Use: Never used  Substance and Sexual Activity  . Alcohol use: No  . Drug use: No  . Sexual activity: Yes    Birth control/protection: None  Other Topics Concern  . Not on file  Social History Narrative  . Not on file   Social Determinants of Health   Financial Resource Strain: Not on file  Food Insecurity: Not on file  Transportation Needs: Not on file  Physical Activity: Not on file  Stress: Not on file  Social Connections: Not on file     Family History: The patient's  family history includes Anxiety disorder in her daughter, mother, and son; Cerebral palsy in her brother; Depression in her daughter; Diabetes in her father and mother; Hyperlipidemia in her brother; Hypertension in her mother; Miscarriages / [place] in her mother.  ROS:   Please see the history of present illness.     All other systems reviewed and are negative.  EKGs/Labs/Other Studies Reviewed:    The following studies were reviewed today:   EKG:  EKG is  ordered today.  The ekg ordered today demonstrates normal sinus rhythm, less than 1 mm ST depression in leads III, aVF, V5/6, rate 87  Recent Labs: 01/19/2020: ALT  17; TSH 1.86 03/09/2020: BUN 13; Creatinine, Ser 0.67; Magnesium 2.1; Potassium 4.3; Sodium 138  Recent Lipid Panel No results found for: CHOL, TRIG, HDL, CHOLHDL, VLDL, LDLCALC, LDLDIRECT  Physical Exam:    VS:  There were no vitals taken for this visit.    Wt Readings from Last 3 Encounters:  02/12/20 150 lb (68 kg)  02/02/20 144 lb (65.3 kg)  01/19/20 144 lb (65.3 kg)     GEN:  Well nourished, well developed in no acute distress HEENT: Normal NECK: No JVD; No carotid bruits LYMPHATICS: No lymphadenopathy CARDIAC: RRR, no murmurs, rubs, gallops RESPIRATORY:  Clear to auscultation without rales, wheezing or rhonchi  ABDOMEN: Soft, non-tender, non-distended MUSCULOSKELETAL:  No edema; No deformity  SKIN: Warm and dry NEUROLOGIC:  Alert and oriented x 3 PSYCHIATRIC:  Normal affect   ASSESSMENT:    No diagnosis found. PLAN:    Atrial fibrillation: Reported history, but unclear diagnosis.  Will obtain records from [facility] when A. fib was diagnosed in 2010/2011.  CHA2DS2-VASc score 2 (hypertension, female), will hold off on starting anticoagulation until A. fib confirmed.  Echocardiogram on 03/08/2020 showed normal biventricular function, no significant valvular disease, possible small PFO.  -Will check Zio patch x2 weeks.  -Continue Toprol-XL 25 mg daily.  Hypertension: On Toprol-XL 25 mg daily and hydrochlorothiazide 25 mg daily  RTC in***   Medication Adjustments/Labs and Tests Ordered: Current medicines are reviewed at length with the patient today.  Concerns regarding medicines are outlined above.  No orders of the defined types were placed in this encounter.  No orders of the defined types were placed in this encounter.   There are no Patient Instructions on file for this visit.   Signed, Little Ishikawa, MD  06/05/2020 1:31 PM    [facility]

## 2020-06-06 ENCOUNTER — Ambulatory Visit: Payer: 59 | Admitting: Cardiology

## 2020-06-06 ENCOUNTER — Telehealth: Payer: Self-pay | Admitting: Physician Assistant

## 2020-06-06 ENCOUNTER — Other Ambulatory Visit: Payer: Self-pay

## 2020-06-06 DIAGNOSIS — I10 Essential (primary) hypertension: Secondary | ICD-10-CM

## 2020-06-06 DIAGNOSIS — Z8679 Personal history of other diseases of the circulatory system: Secondary | ICD-10-CM

## 2020-06-06 MED ORDER — METOPROLOL SUCCINATE ER 25 MG PO TB24: 25.0000 mg | ORAL_TABLET | Freq: Every day | ORAL | 3 refills | Status: DC

## 2020-06-06 NOTE — Progress Notes (Incomplete)
Cardiology Office Note:    Date:  06/06/2020   ID:  Aimee Maldonado, DOB 1965-06-13, MRN [id number]  PCP:  Waldon Merl, PA-C  Cardiologist:  No primary care provider on file.  Electrophysiologist:  None   Referring MD: Waldon Merl, PA-C   No chief complaint on file.  History of Present Illness:    Aimee Maldonado is a 55 y.o. female with a hx of atrial fibrillation, hypertension who presents for follow-up.  She was referred by Marcelline Mates, PA for evaluation of possible atrial fibrillation, initially seen on 02/12/2020.  She reports she first started having issues with palpitations in her 64s.  She wore a monitor at that time but is unsure of the results.  Reports that during her second pregnancy at age 89 she was having issues with palpitations and tachycardia.  States that she was hospitalized at [facility] in [place] and thinks that she was told she had atrial fibrillation.  Reports palpitations resolved with delivery.  Recently has been having issues with high blood pressure and palpitations.  Was started on metoprolol with resolution of her palpitations.  Was also started on HCTZ 12.5 mg daily at urgent care, but reports she ran out of this medication 2 days ago.  States that BP was still running 140s to 160s over 90s to 100s on HCTZ 12.5 mg daily.  She reports the palpitations she was having occurred about twice per week, can last for hours.  Felt like heart was racing.  She denies any chest pain, dyspnea, or lower extremity edema.  She reports she has had some episodes of lightheadedness when her blood pressure is high, but denies any syncopal episodes.  No smoking history.  Family history includes mother has atrial fibrillation.  Echocardiogram on 03/08/2020 showed normal biventricular function, no significant valvular disease, possible small PFO.  Cardiac monitor was ordered but has not been done.  Since last clinic visit,    Past Medical History:  Diagnosis Date   . Anxiety   . Arthritis   . Atrial fibrillation   . Blood transfusion without reported diagnosis   . Depression   . Hypertension     Past Surgical History:  Procedure Laterality Date  . CESAREAN SECTION  05/2009  . mscarrigage      Current Medications: No outpatient medications have been marked as taking for the 06/06/20 encounter (Appointment) with Little Ishikawa, MD.     Allergies:   Patient has no known allergies.   Social History   Socioeconomic History  . Marital status: Married    Spouse name: Not on file  . Number of children: Not on file  . Years of education: Not on file  . Highest education level: Not on file  Occupational History  . Not on file  Tobacco Use  . Smoking status: Never Smoker  . Smokeless tobacco: Never Used  Vaping Use  . Vaping Use: Never used  Substance and Sexual Activity  . Alcohol use: No  . Drug use: No  . Sexual activity: Yes    Birth control/protection: None  Other Topics Concern  . Not on file  Social History Narrative  . Not on file   Social Determinants of Health   Financial Resource Strain: Not on file  Food Insecurity: Not on file  Transportation Needs: Not on file  Physical Activity: Not on file  Stress: Not on file  Social Connections: Not on file     Family History: The patient's  family history includes Anxiety disorder in her daughter, mother, and son; Cerebral palsy in her brother; Depression in her daughter; Diabetes in her father and mother; Hyperlipidemia in her brother; Hypertension in her mother; Miscarriages / [place] in her mother.  ROS:   Please see the history of present illness.   (+)  All other systems reviewed and are negative.  EKGs/Labs/Other Studies Reviewed:    The following studies were reviewed today:   EKG:   02/12/2020: normal sinus rhythm, less than 1 mm ST depression in leads III, aVF, V5/6, rate 87 06/06/2020: normal sinus rhythm, less than 1 mm ST depression in leads III,  aVF, V5/6, rate * bpm  Recent Labs: 01/19/2020: ALT 17; TSH 1.86 03/09/2020: BUN 13; Creatinine, Ser 0.67; Magnesium 2.1; Potassium 4.3; Sodium 138  Recent Lipid Panel No results found for: CHOL, TRIG, HDL, CHOLHDL, VLDL, LDLCALC, LDLDIRECT  Physical Exam:    VS:  There were no vitals taken for this visit.    Wt Readings from Last 3 Encounters:  02/12/20 150 lb (68 kg)  02/02/20 144 lb (65.3 kg)  01/19/20 144 lb (65.3 kg)     GEN:  Well nourished, well developed in no acute distress HEENT: Normal NECK: No JVD; No carotid bruits LYMPHATICS: No lymphadenopathy CARDIAC: RRR, no murmurs, rubs, gallops RESPIRATORY:  Clear to auscultation without rales, wheezing or rhonchi  ABDOMEN: Soft, non-tender, non-distended MUSCULOSKELETAL:  No edema; No deformity  SKIN: Warm and dry NEUROLOGIC:  Alert and oriented x 3 PSYCHIATRIC:  Normal affect   ASSESSMENT:    No diagnosis found. PLAN:    Atrial fibrillation: Reported history, but unclear diagnosis.  Will obtain records from [facility] when A. fib was diagnosed in 2010/2011.  CHA2DS2-VASc score 2 (hypertension, female), will hold off on starting anticoagulation until A. fib confirmed.  Echocardiogram on 03/08/2020 showed normal biventricular function, no significant valvular disease, possible small PFO.  -Will check Zio patch x2 weeks.  -Continue Toprol-XL 25 mg daily.  Hypertension: On Toprol-XL 25 mg daily and hydrochlorothiazide 25 mg daily  RTC in *** months.   Medication Adjustments/Labs and Tests Ordered: Current medicines are reviewed at length with the patient today.  Concerns regarding medicines are outlined above.  No orders of the defined types were placed in this encounter.  No orders of the defined types were placed in this encounter.   There are no Patient Instructions on file for this visit.   I,Mathew Stumpf,acting as a Neurosurgeon for Little Ishikawa, MD.,have documented all relevant documentation on  the behalf of Little Ishikawa, MD,as directed by  Little Ishikawa, MD while in the presence of Little Ishikawa, MD.  ***  Signed, Carlena Bjornstad  06/06/2020 7:59 AM    [facility]

## 2020-06-06 NOTE — Telephone Encounter (Signed)
Medication sent to patient's pharmacy.

## 2020-06-06 NOTE — Telephone Encounter (Signed)
Pt called in asking for a refill on the Metoprolol, she states that is out of this medication. Pt uses [facility] on [address].  Has a TOC with Dr .Neva Seat on 08/11/20   Please advise

## 2020-07-24 NOTE — Progress Notes (Signed)
Cardiology Office Note:    Date:  07/28/2020   ID:  Aimee Maldonado, DOB [birth year], MRN [id number]  PCP:  Waldon Merl, PA-C  Cardiologist:  None  Electrophysiologist:  None   Referring MD: Waldon Merl, PA-C   No chief complaint on file.  History of Present Illness:    Aimee Maldonado is a 55 y.o. female with a hx of atrial fibrillation, hypertension who presents for follow-up.  She was referred by Marcelline Mates, PA for evaluation of possible atrial fibrillation, initially seen on 02/12/2020.  She reports she first started having issues with palpitations in her 52s.  She wore a monitor at that time but is unsure of the results.  Reports that during her second pregnancy at age 43 she was having issues with palpitations and tachycardia.  States that she was hospitalized at [facility] in [place] and thinks that she was told she had atrial fibrillation (ADDENDUM: patient looked into this and said she was actually told it was SVT).  Reports palpitations resolved with delivery.  Recently has been having issues with high blood pressure and palpitations.  Was started on metoprolol with resolution of her palpitations.  Was also started on HCTZ 12.5 mg daily at urgent care, but reports she ran out of this medication 2 days ago.  States that BP was still running 140s to 160s over 90s to 100s on HCTZ 12.5 mg daily.  She reports the palpitations she was having occurred about twice per week, can last for hours.  Felt like heart was racing.  She denies any chest pain, dyspnea, or lower extremity edema.  She reports she has had some episodes of lightheadedness when her blood pressure is high, but denies any syncopal episodes.  No smoking history.  Family history includes mother has atrial fibrillation.  Echocardiogram on 03/08/2020 showed normal biventricular function, no significant valvular disease, possible small PFO.  Cardiac monitor was ordered but has not been done.  Since last clinic  visit, she reports that she has been doing well.  Reports palpitations occur rarely maybe about once per year not causing any distress.  Denies any chest pain, dyspnea, lightheadedness, syncope, lower extremity edema, or palpitations.   Past Medical History:  Diagnosis Date   Anxiety    Arthritis    Atrial fibrillation    Blood transfusion without reported diagnosis    Depression    Hypertension     Past Surgical History:  Procedure Laterality Date   CESAREAN SECTION  05/2009   mscarrigage      Current Medications: Current Meds  Medication Sig   buPROPion (WELLBUTRIN XL) 150 MG 24 hr tablet Take 150 mg by mouth at bedtime.   DULoxetine (CYMBALTA) 60 MG capsule Take 60 mg by mouth daily.   hydrochlorothiazide (HYDRODIURIL) 25 MG tablet Take 1 tablet (25 mg total) by mouth daily.   metoprolol succinate (TOPROL-XL) 25 MG 24 hr tablet Take 1 tablet (25 mg total) by mouth daily.     Allergies:   Patient has no known allergies.   Social History   Socioeconomic History   Marital status: Married    Spouse name: Not on file   Number of children: Not on file   Years of education: Not on file   Highest education level: Not on file  Occupational History   Not on file  Tobacco Use   Smoking status: Never   Smokeless tobacco: Never  Vaping Use   Vaping Use: Never used  Substance  and Sexual Activity   Alcohol use: No   Drug use: No   Sexual activity: Yes    Birth control/protection: None  Other Topics Concern   Not on file  Social History Narrative   Not on file   Social Determinants of Health   Financial Resource Strain: Not on file  Food Insecurity: Not on file  Transportation Needs: Not on file  Physical Activity: Not on file  Stress: Not on file  Social Connections: Not on file     Family History: The patient's family history includes Anxiety disorder in her daughter, mother, and son; Cerebral palsy in her brother; Depression in her daughter; Diabetes in her  father and mother; Hyperlipidemia in her brother; Hypertension in her mother; Miscarriages / [place] in her mother.  ROS:   Please see the history of present illness.   (+)  All other systems reviewed and are negative.  EKGs/Labs/Other Studies Reviewed:    The following studies were reviewed today:   EKG:   02/12/2020: normal sinus rhythm, less than 1 mm ST depression in leads III, aVF, V5/6, rate 87 07/28/2020: normal sinus rhythm, less than 1 mm ST depression in leads II, III, aVF, V5/6, rate 80bpm, nospecific T wave flattening  Recent Labs: 01/19/2020: ALT 17; TSH 1.86 03/09/2020: BUN 13; Creatinine, Ser 0.67; Magnesium 2.1; Potassium 4.3; Sodium 138  Recent Lipid Panel No results found for: CHOL, TRIG, HDL, CHOLHDL, VLDL, LDLCALC, LDLDIRECT  Physical Exam:    VS:  BP 132/77   Pulse 80   Ht 5' (1.524 m)   Wt 150 lb 6.4 oz (68.2 kg)   SpO2 98%   BMI 29.37 kg/m     Wt Readings from Last 3 Encounters:  07/28/20 150 lb 6.4 oz (68.2 kg)  02/12/20 150 lb (68 kg)  02/02/20 144 lb (65.3 kg)     GEN:  Well nourished, well developed in no acute distress HEENT: Normal NECK: No JVD; No carotid bruits LYMPHATICS: No lymphadenopathy CARDIAC: RRR, no murmurs, rubs, gallops RESPIRATORY:  Clear to auscultation without rales, wheezing or rhonchi  ABDOMEN: Soft, non-tender, non-distended MUSCULOSKELETAL:  No edema; No deformity  SKIN: Warm and dry NEUROLOGIC:  Alert and oriented x 3 PSYCHIATRIC:  Normal affect   ASSESSMENT:    1. SVT (supraventricular tachycardia) (HCC)   2. Essential hypertension    PLAN:    SVT: initially reported history of Afib when pregnant in 20/102011 but states that it was actually SVT.  Will obtain records from [facility].  Echocardiogram on 03/08/2020 showed normal biventricular function, no significant valvular disease, possible small PFO.  Zio patch was ordered patient decided not to wear when she found out it was SVT she had not  Afib -Palpitations appear well controlled, will continue Toprol-XL 25 mg daily.  Hypertension: Continue Toprol-XL 25 mg daily and hydrochlorothiazide 25 mg daily  RTC in 1 year   Medication Adjustments/Labs and Tests Ordered: Current medicines are reviewed at length with the patient today.  Concerns regarding medicines are outlined above.  Orders Placed This Encounter  Procedures   EKG 12-Lead    No orders of the defined types were placed in this encounter.   Patient Instructions  Medication Instructions:  Your physician recommends that you continue on your current medications as directed. Please refer to the Current Medication list given to you today.  *If you need a refill on your cardiac medications before your next appointment, please call your pharmacy*  Follow-Up: At [facility], you  and your health needs are our priority.  As part of our continuing mission to provide you with exceptional heart care, we have created designated Provider Care Teams.  These Care Teams include your primary Cardiologist (physician) and Advanced Practice Providers (APPs -  Physician Assistants and Nurse Practitioners) who all work together to provide you with the care you need, when you need it.  We recommend signing up for the patient portal called "MyChart".  Sign up information is provided on this After Visit Summary.  MyChart is used to connect with patients for Virtual Visits (Telemedicine).  Patients are able to view lab/test results, encounter notes, upcoming appointments, etc.  Non-urgent messages can be sent to your provider as well.   To learn more about what you can do with MyChart, go to [email or web address].    Your next appointment:   12 month(s)  The format for your next appointment:   In Person  Provider:   Epifanio Lesches, MD   Other Instructions Call [phone] to speak with our billing department     Signed, Little Ishikawa, MD  07/28/2020 9:11 AM     [facility]

## 2020-07-28 ENCOUNTER — Other Ambulatory Visit: Payer: Self-pay

## 2020-07-28 ENCOUNTER — Encounter: Payer: Self-pay | Admitting: Cardiology

## 2020-07-28 ENCOUNTER — Ambulatory Visit: Payer: 59 | Admitting: Cardiology

## 2020-07-28 VITALS — BP 132/77 | HR 80 | Ht 60.0 in | Wt 150.4 lb

## 2020-07-28 DIAGNOSIS — I471 Supraventricular tachycardia: Secondary | ICD-10-CM

## 2020-07-28 DIAGNOSIS — I10 Essential (primary) hypertension: Secondary | ICD-10-CM | POA: Diagnosis not present

## 2020-07-28 NOTE — Patient Instructions (Addendum)
Medication Instructions:  Your physician recommends that you continue on your current medications as directed. Please refer to the Current Medication list given to you today.  *If you need a refill on your cardiac medications before your next appointment, please call your pharmacy*  Follow-Up: At [facility], you and your health needs are our priority.  As part of our continuing mission to provide you with exceptional heart care, we have created designated Provider Care Teams.  These Care Teams include your primary Cardiologist (physician) and Advanced Practice Providers (APPs -  Physician Assistants and Nurse Practitioners) who all work together to provide you with the care you need, when you need it.  We recommend signing up for the patient portal called "MyChart".  Sign up information is provided on this After Visit Summary.  MyChart is used to connect with patients for Virtual Visits (Telemedicine).  Patients are able to view lab/test results, encounter notes, upcoming appointments, etc.  Non-urgent messages can be sent to your provider as well.   To learn more about what you can do with MyChart, go to [email or web address].    Your next appointment:   12 month(s)  The format for your next appointment:   In Person  Provider:   Epifanio Lesches, MD   Other Instructions Call [phone] to speak with our billing department

## 2020-08-11 ENCOUNTER — Ambulatory Visit: Payer: 59 | Admitting: Family Medicine

## 2020-08-11 ENCOUNTER — Other Ambulatory Visit: Payer: Self-pay

## 2020-08-11 VITALS — BP 124/77 | HR 106 | Temp 98.2°F | Resp 17 | Ht 60.0 in | Wt 148.6 lb

## 2020-08-11 DIAGNOSIS — R413 Other amnesia: Secondary | ICD-10-CM

## 2020-08-11 DIAGNOSIS — F32A Depression, unspecified: Secondary | ICD-10-CM

## 2020-08-11 DIAGNOSIS — I10 Essential (primary) hypertension: Secondary | ICD-10-CM | POA: Diagnosis not present

## 2020-08-11 DIAGNOSIS — Z1211 Encounter for screening for malignant neoplasm of colon: Secondary | ICD-10-CM

## 2020-08-11 DIAGNOSIS — I471 Supraventricular tachycardia: Secondary | ICD-10-CM | POA: Diagnosis not present

## 2020-08-11 DIAGNOSIS — Z1231 Encounter for screening mammogram for malignant neoplasm of breast: Secondary | ICD-10-CM

## 2020-08-11 NOTE — Patient Instructions (Addendum)
I do recommend follow up with psychiatry to discuss your meds and symptoms. Discuss your memory symptoms as well, but sometimes that can be due to depression or anxiety as well. If you need FMLA for time to get to appointments, let me know. See info on stress below.  I will check memory testing, but setting alarms/reminders can help - keep phone nearby.  No med change for now - 3 month follow up for labs.  Return to the clinic or go to the nearest emergency room if any of your symptoms worsen or new symptoms occur.  Textbook of family medicine (9th ed., pp. 0240-9735). [place], [place]: [employer].">  Stress, Adult Stress is a normal reaction to life events. Stress is what you feel when life demands more than you are used to, or more than you think you can handle. Some stress can be useful, such as studying for a test or meeting a deadline at work. Stress that occurs too often or for too long can cause problems. It can affect your emotional health and interfere with relationships and normal daily activities. Too much stress can weaken your body's defense system (immune system) and increase your risk for physical illness. If you already have a medicalproblem, stress can make it worse. What are the causes? All sorts of life events can cause stress. An event that causes stress for one person may not be stressful for another person. Major life events, whether positive or negative, commonly cause stress. Examples include: Losing a job or starting a new job. Losing a loved one. Moving to a new town or home. Getting married or divorced. Having a baby. Getting injured or sick. Less obvious life events can also cause stress, especially if they occur day after day or in combination with each other. Examples include: Working long hours. Driving in traffic. Caring for children. Being in debt. Being in a difficult relationship. What are the signs or symptoms? Stress can cause emotional symptoms,  including: Anxiety. This is feeling worried, afraid, on edge, overwhelmed, or out of control. Anger, including irritation or impatience. Depression. This is feeling sad, down, helpless, or guilty. Trouble focusing, remembering, or making decisions. Stress can cause physical symptoms, including: Aches and pains. These may affect your head, neck, back, stomach, or other areas of your body. Tight muscles or a clenched jaw. Low energy. Trouble sleeping. Stress can cause unhealthy behaviors, including: Eating to feel better (overeating) or skipping meals. Working too much or putting off tasks. Smoking, drinking alcohol, or using drugs to feel better. How is this diagnosed? Stress is diagnosed through an assessment by your health care provider. He or she may diagnose this condition based on: Your symptoms and any stressful life events. Your medical history. Tests to rule out other causes of your symptoms. Depending on your condition, your health care provider may refer you to aspecialist for further evaluation. How is this treated?  Stress management techniques are the recommended treatment for stress. Medicineis not typically recommended for the treatment of stress. Techniques to reduce your reaction to stressful life events include: Stress identification. Monitor yourself for symptoms of stress and identify what causes stress for you. These skills may help you to avoid or prepare for stressful events. Time management. Set your priorities, keep a calendar of events, and learn to say no. Taking these actions can help you avoid making too many commitments. Techniques for coping with stress include: Rethinking the problem. Try to think realistically about stressful events rather than ignoring them or  overreacting. Try to find the positives in a stressful situation rather than focusing on the negatives. Exercise. Physical exercise can release both physical and emotional tension. The key is to find  a form of exercise that you enjoy and do it regularly. Relaxation techniques. These relax the body and mind. The key is to find one or more that you enjoy and use the techniques regularly. Examples include: Meditation, deep breathing, or progressive relaxation techniques. Yoga or tai chi. Biofeedback, mindfulness techniques, or journaling. Listening to music, being out in nature, or participating in other hobbies. Practicing a healthy lifestyle. Eat a balanced diet, drink plenty of water, limit or avoid caffeine, and get plenty of sleep. Having a strong support network. Spend time with family, friends, or other people you enjoy being around. Express your feelings and talk things over with someone you trust. Counseling or talk therapy with a mental health professional may be helpful if you are havingtrouble managing stress on your own. Follow these instructions at home: Lifestyle  Avoid drugs. Do not use any products that contain nicotine or tobacco, such as cigarettes, e-cigarettes, and chewing tobacco. If you need help quitting, ask your health care provider. Limit alcohol intake to no more than 1 drink a day for nonpregnant women and 2 drinks a day for men. One drink equals 12 oz of beer, 5 oz of wine, or 1 oz of hard liquor Do not use alcohol or drugs to relax. Eat a balanced diet that includes fresh fruits and vegetables, whole grains, lean meats, fish, eggs, and beans, and low-fat dairy. Avoid processed foods and foods high in added fat, sugar, and salt. Exercise at least 30 minutes on 5 or more days each week. Get 7-8 hours of sleep each night.  General instructions  Practice stress management techniques as discussed with your health care provider. Drink enough fluid to keep your urine clear or pale yellow. Take over-the-counter and prescription medicines only as told by your health care provider. Keep all follow-up visits as told by your health care provider. This is  important.  Contact a health care provider if: Your symptoms get worse. You have new symptoms. You feel overwhelmed by your problems and can no longer manage them on your own. Get help right away if: You have thoughts of hurting yourself or others. If you ever feel like you may hurt yourself or others, or have thoughts about taking your own life, get help right away. You can go to your nearest emergency department or call: Your local emergency services (911 in the [place] A suicide crisis helpline, such as the [facility] at (224)234-0299. This is open 24 hours a day. Summary Stress is a normal reaction to life events. It can cause problems if it happens too often or for too long. Practicing stress management techniques is the best way to treat stress. Counseling or talk therapy with a mental health professional may be helpful if you are having trouble managing stress on your own. This information is not intended to replace advice given to you by your health care provider. Make sure you discuss any questions you have with your healthcare provider. Document Revised: 10/09/2019 Document Reviewed: 10/09/2019 Elsevier Patient Education  2022 [employer].     If you have lab work done today you will be contacted with your lab results within the next 2 weeks.  If you have not heard from [place] then please contact us. The fastest way to get your results is to  register for My Chart.   IF you received an x-ray today, you will receive an invoice from [facility]. Please contact [facility] at [phone] with questions or concerns regarding your invoice.   IF you received labwork today, you will receive an invoice from [facility]. Please contact [facility] at [phone] with questions or concerns regarding your invoice.   Our billing staff will not be able to assist you with questions regarding bills from these companies.  You will be contacted with the  lab results as soon as they are available. The fastest way to get your results is to activate your My Chart account. Instructions are located on the last page of this paperwork. If you have not heard from [place] regarding the results in 2 weeks, please contact this office.

## 2020-08-11 NOTE — Progress Notes (Signed)
Subjective:  Patient ID: Aimee Maldonado, female    DOB: 10-04-1965  Age: 55 y.o. MRN: [id number]  CC:  Chief Complaint  Patient presents with   Transitions Of Care    Patient states she is here for a TOC and Medication refill.    HPI Aimee Maldonado presents for   Presents for transition care, previous primary care provider Aimee Aquas, PA-C.  Depression: Treated with Cymbalta 60 mg daily, Wellbutrin 150 mg nightly.  Followed by psychiatry NP. Cost prohibitive if unable to make appt. [facility].  Feels like depression stable. Denies worsening symptoms. Better controlled.  Worse if skipping day or two of meds.   Some increased stress recently with teenagers at home - trouble with sleep/fatigue. No recent counseling.  Alprazolam rarely in past - form psychiatry. Planned psychiatry appt this week.  Trouble with thinking at times/focusing at times past year. Trouble with memory "everything".  Forgets appointments at times. Sets reminders. No new stressors, but same stress at time.   Depression screen [facility] 2/9 08/11/2020 01/19/2020  Decreased Interest 1 0  Down, Depressed, Hopeless 1 1  PHQ - 2 Score 2 1  Altered sleeping 2 0  Tired, decreased energy 2 1  Change in appetite 3 0  Feeling bad or failure about yourself  2 0  Trouble concentrating 0 0  Moving slowly or fidgety/restless 0 0  Suicidal thoughts 0 0  PHQ-9 Score 11 2  Difficult doing work/chores Not difficult at all Somewhat difficult   Hypertension: HCTZ 25 mg daily, Toprol-XL 25 mg daily.  Also with history of atrial fibrillation by chart but on review of appointment with cardiologist  Aimee Maldonado, appointment June 23, history of SVT.  Echo March 08, 2020 with normal biventricular function.  No significant valvular disease.  Possible small PFO.  Zio patch was ordered but not performed once she found out she had SVT, not A. fib.  Continue metoprolol XL 25 mg daily.  1 year cardiac follow-up. Rare palpitations -  noted in pregnancy or when increase stress.  Home readings: 130/80.  BP Readings from Last 3 Encounters:  08/11/20 124/77  07/28/20 132/77  02/12/20 (!) 164/96   Lab Results  Component Value Date   CREATININE 0.67 03/09/2020    HM: Due for colonoscopy, mammogram. Agrees to referral.   History Patient Active Problem List   Diagnosis Date Noted   SVT (supraventricular tachycardia) ([place]) 08/11/2020   Past Medical History:  Diagnosis Date   Anxiety    Arthritis    Atrial fibrillation    Blood transfusion without reported diagnosis    Depression    Hypertension    Past Surgical History:  Procedure Laterality Date   CESAREAN SECTION  05/2009   mscarrigage     No Known Allergies Prior to Admission medications   Medication Sig Start Date End Date Taking? Authorizing Provider  buPROPion (WELLBUTRIN XL) 150 MG 24 hr tablet Take 150 mg by mouth at bedtime. 12/16/19  Yes [provider]  DULoxetine (CYMBALTA) 60 MG capsule Take 60 mg by mouth daily. 12/29/19  Yes [provider]  hydrochlorothiazide (HYDRODIURIL) 25 MG tablet Take 1 tablet (25 mg total) by mouth daily. 02/12/20  Yes Donato Heinz, MD  metoprolol succinate (TOPROL-XL) 25 MG 24 hr tablet Take 1 tablet (25 mg total) by mouth daily. 06/06/20  Yes Midge Minium, MD   Social History   Socioeconomic History   Marital status: Married    Spouse name: Not on  file   Number of children: Not on file   Years of education: Not on file   Highest education level: Not on file  Occupational History   Not on file  Tobacco Use   Smoking status: Never   Smokeless tobacco: Never  Vaping Use   Vaping Use: Never used  Substance and Sexual Activity   Alcohol use: No   Drug use: No   Sexual activity: Yes    Birth control/protection: None  Other Topics Concern   Not on file  Social History Narrative   Not on file   Social Determinants of Health   Financial Resource Strain: Not on file  Food  Insecurity: Not on file  Transportation Needs: Not on file  Physical Activity: Not on file  Stress: Not on file  Social Connections: Not on file  Intimate Partner Violence: Not on file    Review of Systems  Constitutional:  Negative for fatigue and unexpected weight change.  Respiratory:  Negative for chest tightness and shortness of breath.   Cardiovascular:  Negative for chest pain, palpitations and leg swelling.  Gastrointestinal:  Negative for abdominal pain and blood in stool.  Neurological:  Negative for dizziness, syncope, light-headedness and headaches.    Objective:   Vitals:   08/11/20 1346  BP: 124/77  Pulse: (!) 106  Resp: 17  Temp: 98.2 F (36.8 C)  TempSrc: Temporal  SpO2: 99%  Weight: 148 lb 9.6 oz (67.4 kg)  Height: 5' (1.524 m)     Physical Exam Vitals reviewed.  Constitutional:      Appearance: Normal appearance. She is well-developed.  HENT:     Head: Normocephalic and atraumatic.  Eyes:     Conjunctiva/sclera: Conjunctivae normal.     Pupils: Pupils are equal, round, and reactive to light.  Neck:     Vascular: No carotid bruit.  Cardiovascular:     Rate and Rhythm: Normal rate and regular rhythm.     Heart sounds: Normal heart sounds.  Pulmonary:     Effort: Pulmonary effort is normal.     Breath sounds: Normal breath sounds.  Abdominal:     Palpations: Abdomen is soft. There is no pulsatile mass.     Tenderness: There is no abdominal tenderness.  Musculoskeletal:     Right lower leg: No edema.     Left lower leg: No edema.  Skin:    General: Skin is warm and dry.  Neurological:     Mental Status: She is alert and oriented to person, place, and time.  Psychiatric:        Mood and Affect: Mood normal.        Behavior: Behavior normal.        Thought Content: Thought content normal.        Judgment: Judgment normal.    MOCA 27/30.    Assessment & Plan:  [name] is a 55 y.o. female . Primary hypertension  -Stable on  current meds, continue same.  Repeat labs in the next few months.  SVT (supraventricular tachycardia) (HCC)  -Rare palpitations as above, no med changes.  Continue routine follow-up with cardiology.  Memory changes Depression, unspecified depression type  -Elevated PHQ as above, reports some of the symptoms may be due to her stressors.  Question whether some of her memory concerns may be related to stress/depression.  MoCA score 27 out of 30 as above.  Plan for initial discussion with her psychiatric provider.  Option of neurology/memory specialist evaluation as  well.  Handout given on stress and stress management.  May also benefit with meeting with therapist.  Recommended reminders on her phone/alarms to help with remembering appointments and other memory concerns for now.  Screen for colon cancer - Plan: Ambulatory referral to Gastroenterology  Encounter for screening mammogram for malignant neoplasm of breast - Plan: MM Digital Screening   No orders of the defined types were placed in this encounter.  Patient Instructions  I do recommend follow up with psychiatry to discuss your meds and symptoms. Discuss your memory symptoms as well, but sometimes that can be due to depression or anxiety as well. If you need FMLA for time to get to appointments, let me know. See info on stress below.  I will check memory testing, but setting alarms/reminders can help - keep phone nearby.  No med change for now - 3 month follow up for labs.  Return to the clinic or go to the nearest emergency room if any of your symptoms worsen or new symptoms occur.  Textbook of family medicine (9th ed., pp. 9774-1423). [place]: [employer].">  Stress, Adult Stress is a normal reaction to life events. Stress is what you feel when life demands more than you are used to, or more than you think you can handle. Some stress can be useful, such as studying for a test or meeting a deadline at work. Stress that occurs too  often or for too long can cause problems. It can affect your emotional health and interfere with relationships and normal daily activities. Too much stress can weaken your body's defense system (immune system) and increase your risk for physical illness. If you already have a medicalproblem, stress can make it worse. What are the causes? All sorts of life events can cause stress. An event that causes stress for one person may not be stressful for another person. Major life events, whether positive or negative, commonly cause stress. Examples include: Losing a job or starting a new job. Losing a loved one. Moving to a new town or home. Getting married or divorced. Having a baby. Getting injured or sick. Less obvious life events can also cause stress, especially if they occur day after day or in combination with each other. Examples include: Working long hours. Driving in traffic. Caring for children. Being in debt. Being in a difficult relationship. What are the signs or symptoms? Stress can cause emotional symptoms, including: Anxiety. This is feeling worried, afraid, on edge, overwhelmed, or out of control. Anger, including irritation or impatience. Depression. This is feeling sad, down, helpless, or guilty. Trouble focusing, remembering, or making decisions. Stress can cause physical symptoms, including: Aches and pains. These may affect your head, neck, back, stomach, or other areas of your body. Tight muscles or a clenched jaw. Low energy. Trouble sleeping. Stress can cause unhealthy behaviors, including: Eating to feel better (overeating) or skipping meals. Working too much or putting off tasks. Smoking, drinking alcohol, or using drugs to feel better. How is this diagnosed? Stress is diagnosed through an assessment by your health care provider. He or she may diagnose this condition based on: Your symptoms and any stressful life events. Your medical history. Tests to rule out  other causes of your symptoms. Depending on your condition, your health care provider may refer you to aspecialist for further evaluation. How is this treated?  Stress management techniques are the recommended treatment for stress. Medicineis not typically recommended for the treatment of stress. Techniques to reduce your reaction to  stressful life events include: Stress identification. Monitor yourself for symptoms of stress and identify what causes stress for you. These skills may help you to avoid or prepare for stressful events. Time management. Set your priorities, keep a calendar of events, and learn to say no. Taking these actions can help you avoid making too many commitments. Techniques for coping with stress include: Rethinking the problem. Try to think realistically about stressful events rather than ignoring them or overreacting. Try to find the positives in a stressful situation rather than focusing on the negatives. Exercise. Physical exercise can release both physical and emotional tension. The key is to find a form of exercise that you enjoy and do it regularly. Relaxation techniques. These relax the body and mind. The key is to find one or more that you enjoy and use the techniques regularly. Examples include: Meditation, deep breathing, or progressive relaxation techniques. Yoga or tai chi. Biofeedback, mindfulness techniques, or journaling. Listening to music, being out in nature, or participating in other hobbies. Practicing a healthy lifestyle. Eat a balanced diet, drink plenty of water, limit or avoid caffeine, and get plenty of sleep. Having a strong support network. Spend time with family, friends, or other people you enjoy being around. Express your feelings and talk things over with someone you trust. Counseling or talk therapy with a mental health professional may be helpful if you are havingtrouble managing stress on your own. Follow these instructions at  home: Lifestyle  Avoid drugs. Do not use any products that contain nicotine or tobacco, such as cigarettes, e-cigarettes, and chewing tobacco. If you need help quitting, ask your health care provider. Limit alcohol intake to no more than 1 drink a day for nonpregnant women and 2 drinks a day for men. One drink equals 12 oz of beer, 5 oz of wine, or 1 oz of hard liquor Do not use alcohol or drugs to relax. Eat a balanced diet that includes fresh fruits and vegetables, whole grains, lean meats, fish, eggs, and beans, and low-fat dairy. Avoid processed foods and foods high in added fat, sugar, and salt. Exercise at least 30 minutes on 5 or more days each week. Get 7-8 hours of sleep each night.  General instructions  Practice stress management techniques as discussed with your health care provider. Drink enough fluid to keep your urine clear or pale yellow. Take over-the-counter and prescription medicines only as told by your health care provider. Keep all follow-up visits as told by your health care provider. This is important.  Contact a health care provider if: Your symptoms get worse. You have new symptoms. You feel overwhelmed by your problems and can no longer manage them on your own. Get help right away if: You have thoughts of hurting yourself or others. If you ever feel like you may hurt yourself or others, or have thoughts about taking your own life, get help right away. You can go to your nearest emergency department or call: Your local emergency services (911 in the [place] A suicide crisis helpline, such as the [facility] at (314)045-7927. This is open 24 hours a day. Summary Stress is a normal reaction to life events. It can cause problems if it happens too often or for too long. Practicing stress management techniques is the best way to treat stress. Counseling or talk therapy with a mental health professional may be helpful if you are having  trouble managing stress on your own. This information is not intended to replace advice  given to you by your health care provider. Make sure you discuss any questions you have with your healthcare provider. Document Revised: 10/09/2019 Document Reviewed: 10/09/2019 Elsevier Patient Education  2022 [employer].     If you have lab work done today you will be contacted with your lab results within the next 2 weeks.  If you have not heard from [place] then please contact us. The fastest way to get your results is to register for My Chart.   IF you received an x-ray today, you will receive an invoice from [facility]. Please contact [facility] at [phone] with questions or concerns regarding your invoice.   IF you received labwork today, you will receive an invoice from [facility]. Please contact [facility] at [phone] with questions or concerns regarding your invoice.   Our billing staff will not be able to assist you with questions regarding bills from these companies.  You will be contacted with the lab results as soon as they are available. The fastest way to get your results is to activate your My Chart account. Instructions are located on the last page of this paperwork. If you have not heard from [place] regarding the results in 2 weeks, please contact this office.       Signed,   Merri Ray, MD [place], [facility] 08/11/20 2:51 PM

## 2020-10-25 ENCOUNTER — Other Ambulatory Visit: Payer: Self-pay | Admitting: Family Medicine

## 2020-10-25 DIAGNOSIS — I10 Essential (primary) hypertension: Secondary | ICD-10-CM

## 2020-10-25 DIAGNOSIS — Z8679 Personal history of other diseases of the circulatory system: Secondary | ICD-10-CM

## 2020-11-11 ENCOUNTER — Telehealth: Payer: Self-pay | Admitting: Family Medicine

## 2020-11-11 ENCOUNTER — Ambulatory Visit (INDEPENDENT_AMBULATORY_CARE_PROVIDER_SITE_OTHER): Payer: 59 | Admitting: Family Medicine

## 2020-11-11 ENCOUNTER — Encounter: Payer: Self-pay | Admitting: Family Medicine

## 2020-11-11 ENCOUNTER — Other Ambulatory Visit: Payer: Self-pay

## 2020-11-11 VITALS — BP 118/62 | HR 82 | Temp 98.2°F | Resp 16 | Ht 60.0 in | Wt 151.0 lb

## 2020-11-11 DIAGNOSIS — Z1211 Encounter for screening for malignant neoplasm of colon: Secondary | ICD-10-CM

## 2020-11-11 DIAGNOSIS — Z13220 Encounter for screening for lipoid disorders: Secondary | ICD-10-CM

## 2020-11-11 DIAGNOSIS — I10 Essential (primary) hypertension: Secondary | ICD-10-CM

## 2020-11-11 DIAGNOSIS — F32A Depression, unspecified: Secondary | ICD-10-CM | POA: Diagnosis not present

## 2020-11-11 DIAGNOSIS — R002 Palpitations: Secondary | ICD-10-CM | POA: Diagnosis not present

## 2020-11-11 DIAGNOSIS — Z8679 Personal history of other diseases of the circulatory system: Secondary | ICD-10-CM

## 2020-11-11 DIAGNOSIS — F439 Reaction to severe stress, unspecified: Secondary | ICD-10-CM

## 2020-11-11 DIAGNOSIS — G4700 Insomnia, unspecified: Secondary | ICD-10-CM

## 2020-11-11 MED ORDER — METOPROLOL SUCCINATE ER 25 MG PO TB24: 25.0000 mg | ORAL_TABLET | Freq: Every day | ORAL | 1 refills | Status: DC

## 2020-11-11 NOTE — Patient Instructions (Addendum)
Keep follow up with psychiatry to discuss med changes, and counseling options. Meditation closet may be helpful. See other stress management techniques below and information and sleep.  Melatonin nightly, tylenol PM if needed, but start some form of exercise like walking daily - ultimate goal of 150 min. Start low, go slow to get to that goal.   Insomnia Insomnia is a sleep disorder that makes it difficult to fall asleep or stay asleep. Insomnia can cause fatigue, low energy, difficulty concentrating, mood swings, and poor performance at work or school. There are three different ways to classify insomnia: Difficulty falling asleep. Difficulty staying asleep. Waking up too early in the morning. Any type of insomnia can be long-term (chronic) or short-term (acute). Both are common. Short-term insomnia usually lasts for three months or less. Chronic insomnia occurs at least three times a week for longer than three months. What are the causes? Insomnia may be caused by another condition, situation, or substance, such as: Anxiety. Certain medicines. Gastroesophageal reflux disease (GERD) or other gastrointestinal conditions. Asthma or other breathing conditions. Restless legs syndrome, sleep apnea, or other sleep disorders. Chronic pain. Menopause. Stroke. Abuse of alcohol, tobacco, or illegal drugs. Mental health conditions, such as depression. Caffeine. Neurological disorders, such as Alzheimer's disease. An overactive thyroid (hyperthyroidism). Sometimes, the cause of insomnia may not be known. What increases the risk? Risk factors for insomnia include: Gender. Women are affected more often than men. Age. Insomnia is more common as you get older. Stress. Lack of exercise. Irregular work schedule or working night shifts. Traveling between different time zones. Certain medical and mental health conditions. What are the signs or symptoms? If you have insomnia, the main symptom is  having trouble falling asleep or having trouble staying asleep. This may lead to other symptoms, such as: Feeling fatigued or having low energy. Feeling nervous about going to sleep. Not feeling rested in the morning. Having trouble concentrating. Feeling irritable, anxious, or depressed. How is this diagnosed? This condition may be diagnosed based on: Your symptoms and medical history. Your health care provider may ask about: Your sleep habits. Any medical conditions you have. Your mental health. A physical exam. How is this treated? Treatment for insomnia depends on the cause. Treatment may focus on treating an underlying condition that is causing insomnia. Treatment may also include: Medicines to help you sleep. Counseling or therapy. Lifestyle adjustments to help you sleep better. Follow these instructions at home: Eating and drinking  Limit or avoid alcohol, caffeinated beverages, and cigarettes, especially close to bedtime. These can disrupt your sleep. Do not eat a large meal or eat spicy foods right before bedtime. This can lead to digestive discomfort that can make it hard for you to sleep. Sleep habits  Keep a sleep diary to help you and your health care provider figure out what could be causing your insomnia. Write down: When you sleep. When you wake up during the night. How well you sleep. How rested you feel the next day. Any side effects of medicines you are taking. What you eat and drink. Make your bedroom a dark, comfortable place where it is easy to fall asleep. Put up shades or blackout curtains to block light from outside. Use a white noise machine to block noise. Keep the temperature cool. Limit screen use before bedtime. This includes: Watching TV. Using your smartphone, tablet, or computer. Stick to a routine that includes going to bed and waking up at the same times every day and night. This  can help you fall asleep faster. Consider making a quiet  activity, such as reading, part of your nighttime routine. Try to avoid taking naps during the day so that you sleep better at night. Get out of bed if you are still awake after 15 minutes of trying to sleep. Keep the lights down, but try reading or doing a quiet activity. When you feel sleepy, go back to bed. General instructions Take over-the-counter and prescription medicines only as told by your health care provider. Exercise regularly, as told by your health care provider. Avoid exercise starting several hours before bedtime. Use relaxation techniques to manage stress. Ask your health care provider to suggest some techniques that may work well for you. These may include: Breathing exercises. Routines to release muscle tension. Visualizing peaceful scenes. Make sure that you drive carefully. Avoid driving if you feel very sleepy. Keep all follow-up visits as told by your health care provider. This is important. Contact a health care provider if: You are tired throughout the day. You have trouble in your daily routine due to sleepiness. You continue to have sleep problems, or your sleep problems get worse. Get help right away if: You have serious thoughts about hurting yourself or someone else. If you ever feel like you may hurt yourself or others, or have thoughts about taking your own life, get help right away. You can go to your nearest emergency department or call: Your local emergency services (911 in the [place] A suicide crisis helpline, such as the [facility] at 3518007646. This is open 24 hours a day. Summary Insomnia is a sleep disorder that makes it difficult to fall asleep or stay asleep. Insomnia can be long-term (chronic) or short-term (acute). Treatment for insomnia depends on the cause. Treatment may focus on treating an underlying condition that is causing insomnia. Keep a sleep diary to help you and your health care provider figure out what  could be causing your insomnia. This information is not intended to replace advice given to you by your health care provider. Make sure you discuss any questions you have with your health care provider. Document Revised: 12/03/2019 Document Reviewed: 12/03/2019 Elsevier Patient Education  2022 [place], Adult Feeling a certain amount of stress is normal. Stress helps our body and mind get ready to deal with the demands of life. Stress hormones can motivate you to do well at work and meet your responsibilities. However severe or long-lasting (chronic) stress can affect your mental and physical health. Chronic stress puts you at higher risk for anxiety, depression, and other health problems like digestive problems, muscle aches, heart disease, high blood pressure, and stroke. What are the causes? Common causes of stress include: Demands from work, such as deadlines, feeling overworked, or having long hours. Pressures at home, such as money issues, disagreements with a spouse, or parenting issues. Pressures from major life changes, such as divorce, moving, loss of a loved one, or chronic illness. You may be at higher risk for stress-related problems if you do not get enough sleep, are in poor health, do not have emotional support, or have a mental health disorder like anxiety or depression. How to recognize stress Stress can make you: Have trouble sleeping. Feel sad, anxious, irritable, or overwhelmed. Lose your appetite. Overeat or want to eat unhealthy foods. Want to use drugs or alcohol. Stress can also cause physical symptoms, such as: Sore, tense muscles, especially in the shoulders and neck. Headaches. Trouble breathing.  A faster heart rate. Stomach pain, nausea, or vomiting. Diarrhea or constipation. Trouble concentrating. Follow these instructions at home: Lifestyle Identify the source of your stress and your reaction to it. See a therapist who can help you  change your reactions. When there are stressful events: Talk about it with family, friends, or co-workers. Try to think realistically about stressful events and not ignore them or overreact. Try to find the positives in a stressful situation and not focus on the negatives. Cut back on responsibilities at work and home, if possible. Ask for help from friends or family members if you need it. Find ways to cope with stress, such as: Meditation. Deep breathing. Yoga or tai chi. Progressive muscle relaxation. Doing art, playing music, or reading. Making time for fun activities. Spending time with family and friends. Get support from family, friends, or spiritual resources. Eating and drinking Eat a healthy diet. This includes: Eating foods that are high in fiber, such as beans, whole grains, and fresh fruits and vegetables. Limiting foods that are high in fat and processed sugars, such as fried and sweet foods. Do not skip meals or overeat. Drink enough fluid to keep your urine pale yellow. Alcohol use Do not drink alcohol if: Your health care provider tells you not to drink. You are pregnant, may be pregnant, or are planning to become pregnant. Drinking alcohol is a way some people try to ease their stress. This can be dangerous, so if you drink alcohol: Limit how much you use to: 0-1 drink a day for women. 0-2 drinks a day for men. Be aware of how much alcohol is in your drink. In the [place] one drink equals one 12 oz bottle of beer (355 mL), one 5 oz glass of wine (148 mL), or one 1 oz glass of hard liquor (44 mL). Activity  Include 30 minutes of exercise in your daily schedule. Exercise is a good stress reducer. Include time in your day for an activity that you find relaxing. Try taking a walk, going on a bike ride, reading a book, or listening to music. Schedule your time in a way that lowers stress, and keep a consistent schedule. Prioritize what is most important to get  done. General instructions Get enough sleep. Try to go to sleep and get up at about the same time every day. Take over-the-counter and prescription medicines only as told by your health care provider. Do not use any products that contain nicotine or tobacco, such as cigarettes, e-cigarettes, and chewing tobacco. If you need help quitting, ask your health care provider. Do not use drugs or smoke to cope with stress. Keep all follow-up visits as told by your health care provider. This is important. Where to find support Talk with your health care provider about stress management or finding a support group. Find a therapist to work with you on your stress management techniques. Contact a health care provider if: Your stress symptoms get worse. You are unable to manage your stress at home. You are struggling to stop using drugs or alcohol. Get help right away if: You may be a danger to yourself or others. You have any thoughts of death or suicide. If you ever feel like you may hurt yourself or others, or have thoughts about taking your own life, get help right away. You can go to your nearest emergency department or call: Your local emergency services (911 in the [place] A suicide crisis helpline, such as the [facility] at  306-010-7369. This is open 24 hours a day. Summary Feeling a certain amount of stress is normal, but severe or long-lasting (chronic) stress can affect your mental and physical health. Chronic stress can put you at higher risk for anxiety, depression, and other health problems like digestive problems, muscle aches, heart disease, high blood pressure, and stroke. You may be at higher risk for stress-related problems if you do not get enough sleep, are in poor health, lack emotional support, or have a mental health disorder like anxiety or depression. Identify the source of your stress and your reaction to it. Try talking about stressful events with  family, friends, or co-workers, finding a coping method, or getting support from spiritual resources. If you need more help, talk with your health care provider about finding a support group or a mental health therapist. This information is not intended to replace advice given to you by your health care provider. Make sure you discuss any questions you have with your health care provider. Document Revised: 04/01/2020 Document Reviewed: 08/20/2018 Elsevier Patient Education  [place].

## 2020-11-11 NOTE — Progress Notes (Signed)
Subjective:  Patient ID: Aimee Maldonado, female    DOB: [birth year]  Age: 55 y.o. MRN: [id number]  CC:  Chief Complaint  Patient presents with   Depression    Pt here for recheck depression and anxiety, notes doing okay no specific questions, pt notes struggles most with energy and motivation to do daily tasks, GAD7 score increased from 3 to 6    Referral    Pt was referred to follow up with cards has not since last OV but notes sxs have resolved. Pt needs a colonoscopy or other screening would like to discuss best option for her.     HPI Aimee Maldonado presents for   Hypertension: Continued on HCTZ, Toprol at last visit.  Rare palpitations discussed at that time.  Continue cardiology follow-up discussed with history of SVT.  Improved since last visit. Not fasting today - candy before visit.   BP Readings from Last 3 Encounters:  11/11/20 118/62  08/11/20 124/77  07/28/20 132/77   Lab Results  Component Value Date   CREATININE 0.67 03/09/2020   Lab Results  Component Value Date   TSH 1.86 01/19/2020   Depression with memory changes Discussed at July 7 visit.  Thought to be contributed by her stressors and possible primary symptoms from stress/depression.  Recommended discussing with her psychiatric provider with option of neurology/memory specialist and handout given on stress management.  Coping techniques with use of her phone/alarms to help with appointments and memory concerns.  Also discussed meeting with therapist. Followed by [facility]. Has not seen since last visit - has appt in 1 week.  Still some decreased motivation, trouble sleeping, feeling overwhelmed.  Has not met with therapist recently- financial concern.  Plans on changing one closet to meditation room, no other changes in stress mgt.  No SI/HI.  Not missing doses of Cymbalta, Wellbutrin. Some stress eating at times. Plans to discuss med changes next week.  Excedrin PM last  night, melatonin few  times - no relief. Has 1/2 xanax in past - not using recently.  Trouble getting to sleep and staying asleep. Naps during day.  No regular exercise.  Memory better.    Depression screen [facility] 2/9 11/11/2020 08/11/2020 01/19/2020  Decreased Interest 1 1 0  Down, Depressed, Hopeless _0 PHQ - 2 Score _1 Altered sleeping 3 2 0  Tired, decreased energy _2 Change in appetite 1 3 0  Feeling bad or failure about yourself  1 2 0  Trouble concentrating 0 0 0  Moving slowly or fidgety/restless 0 0 0  Suicidal thoughts 0 0 0  PHQ-9 Score _3 Difficult doing work/chores - Not difficult at all Somewhat difficult   GAD 7 : Generalized Anxiety Score 11/11/2020 08/11/2020  Nervous, Anxious, on Edge 2 1  Control/stop worrying 1 0  Worry too much - different things 1 1  Trouble relaxing 0 1  Restless 1 0  Easily annoyed or irritable 1 0  Afraid - awful might happen 0 0  Total GAD 7 Score 6 3  Anxiety Difficulty - Not difficult at all     HM: Screening options with colonoscopy versus Cologuard discussed. Discussed timing of repeat testing intervals if normal, as well as potential need for diagnostic Colonoscopy if positive Cologuard. Understanding expressed, and chose Cologuard.   History Patient Active Problem List   Diagnosis Date Noted   SVT (supraventricular tachycardia) ([place]) 08/11/2020   Past Medical History:  Diagnosis Date   Anxiety    Arthritis    Atrial fibrillation    Blood transfusion without reported diagnosis    Depression    Hypertension    Past Surgical History:  Procedure Laterality Date   CESAREAN SECTION  05/2009   mscarrigage     No Known Allergies Prior to Admission medications   Medication Sig Start Date End Date Taking? Authorizing Provider  buPROPion (WELLBUTRIN XL) 150 MG 24 hr tablet Take 150 mg by mouth at bedtime. 12/16/19  Yes [provider]  DULoxetine (CYMBALTA) 30 MG capsule Take 30 mg by mouth daily. Takes 1 tablet daily in  addition to 60 mg for a total of 90 mg daily   Yes [provider]  DULoxetine (CYMBALTA) 60 MG capsule Take by mouth daily. 12/29/19  Yes [provider]  hydrochlorothiazide (HYDRODIURIL) 25 MG tablet Take 1 tablet (25 mg total) by mouth daily. 02/12/20  Yes Donato Heinz, MD  metoprolol succinate (TOPROL-XL) 25 MG 24 hr tablet Take 1 tablet by mouth once daily 10/25/20  Yes Wendie Agreste, MD   Social History   Socioeconomic History   Marital status: Married    Spouse name: Not on file   Number of children: Not on file   Years of education: Not on file   Highest education level: Not on file  Occupational History   Not on file  Tobacco Use   Smoking status: Never   Smokeless tobacco: Never  Vaping Use   Vaping Use: Never used  Substance and Sexual Activity   Alcohol use: No   Drug use: No   Sexual activity: Yes    Birth control/protection: None  Other Topics Concern   Not on file  Social History Narrative   Not on file   Social Determinants of Health   Financial Resource Strain: Not on file  Food Insecurity: Not on file  Transportation Needs: Not on file  Physical Activity: Not on file  Stress: Not on file  Social Connections: Not on file  Intimate Partner Violence: Not on file    Review of Systems  Constitutional:  Positive for fatigue. Negative for unexpected weight change.  Respiratory:  Negative for chest tightness and shortness of breath.   Cardiovascular:  Negative for chest pain, palpitations and leg swelling.  Gastrointestinal:  Negative for abdominal pain and blood in stool.  Neurological:  Negative for dizziness, syncope, light-headedness and headaches.  Psychiatric/Behavioral:  Positive for sleep disturbance. Negative for suicidal ideas.       Objective:   Vitals:   11/11/20 1015  BP: 118/62  Pulse: 82  Resp: 16  Temp: 98.2 F (36.8 C)  TempSrc: Temporal  SpO2: 97%  Weight: 151 lb (68.5 kg)  Height: 5' (1.524 m)      Physical Exam Vitals reviewed.  Constitutional:      Appearance: Normal appearance. She is well-developed.  HENT:     Head: Normocephalic and atraumatic.  Eyes:     Conjunctiva/sclera: Conjunctivae normal.     Pupils: Pupils are equal, round, and reactive to light.  Neck:     Vascular: No carotid bruit.  Cardiovascular:     Rate and Rhythm: Normal rate and regular rhythm.     Heart sounds: Normal heart sounds.  Pulmonary:     Effort: Pulmonary effort is normal.     Breath sounds: Normal breath sounds.  Abdominal:     Palpations: Abdomen is soft. There is no pulsatile mass.  Tenderness: There is no abdominal tenderness.  Musculoskeletal:     Right lower leg: No edema.     Left lower leg: No edema.  Skin:    General: Skin is warm and dry.  Neurological:     Mental Status: She is alert and oriented to person, place, and time.  Psychiatric:        Mood and Affect: Mood normal.        Behavior: Behavior normal.       Assessment & Plan:  Aimee Maldonado is a 55 y.o. female . Depression, unspecified depression type Situational stress Insomnia, unspecified type  -Has close follow-up planned with psychiatry to evaluate for medication changes, defer changes at this time.  Handout given for insomnia.  For insomnia.for insomnia  History of atrial fibrillation - Plan: metoprolol succinate (TOPROL-XL) 25 MG 24 hr tablet Primary hypertension - Plan: metoprolol succinate (TOPROL-XL) 25 MG 24 hr tablet, Comprehensive metabolic panel Palpitations - Plan: TSH  -Stable with less palpitations, check TSH with history of palpitations but improved continue same dose Toprol.  Screening for colon cancer - Plan: Cologuard  Screening for hyperlipidemia - Plan: Lipid panel    Meds ordered this encounter  Medications   metoprolol succinate (TOPROL-XL) 25 MG 24 hr tablet    Sig: Take 1 tablet (25 mg total) by mouth daily.    Dispense:  90 tablet    Refill:  1   Patient  Instructions  Keep follow up with psychiatry to discuss med changes, and counseling options. Meditation closet may be helpful. See other stress management techniques below and information and sleep.  Melatonin nightly, tylenol PM if needed, but start some form of exercise like walking daily - ultimate goal of 150 min. Start low, go slow to get to that goal.   Insomnia Insomnia is a sleep disorder that makes it difficult to fall asleep or stay asleep. Insomnia can cause fatigue, low energy, difficulty concentrating, mood swings, and poor performance at work or school. There are three different ways to classify insomnia: Difficulty falling asleep. Difficulty staying asleep. Waking up too early in the morning. Any type of insomnia can be long-term (chronic) or short-term (acute). Both are common. Short-term insomnia usually lasts for three months or less. Chronic insomnia occurs at least three times a week for longer than three months. What are the causes? Insomnia may be caused by another condition, situation, or substance, such as: Anxiety. Certain medicines. Gastroesophageal reflux disease (GERD) or other gastrointestinal conditions. Asthma or other breathing conditions. Restless legs syndrome, sleep apnea, or other sleep disorders. Chronic pain. Menopause. Stroke. Abuse of alcohol, tobacco, or illegal drugs. Mental health conditions, such as depression. Caffeine. Neurological disorders, such as Alzheimer's disease. An overactive thyroid (hyperthyroidism). Sometimes, the cause of insomnia may not be known. What increases the risk? Risk factors for insomnia include: Gender. Women are affected more often than men. Age. Insomnia is more common as you get older. Stress. Lack of exercise. Irregular work schedule or working night shifts. Traveling between different time zones. Certain medical and mental health conditions. What are the signs or symptoms? If you have insomnia, the main  symptom is having trouble falling asleep or having trouble staying asleep. This may lead to other symptoms, such as: Feeling fatigued or having low energy. Feeling nervous about going to sleep. Not feeling rested in the morning. Having trouble concentrating. Feeling irritable, anxious, or depressed. How is this diagnosed? This condition may be diagnosed based on: Your symptoms and medical  history. Your health care provider may ask about: Your sleep habits. Any medical conditions you have. Your mental health. A physical exam. How is this treated? Treatment for insomnia depends on the cause. Treatment may focus on treating an underlying condition that is causing insomnia. Treatment may also include: Medicines to help you sleep. Counseling or therapy. Lifestyle adjustments to help you sleep better. Follow these instructions at home: Eating and drinking  Limit or avoid alcohol, caffeinated beverages, and cigarettes, especially close to bedtime. These can disrupt your sleep. Do not eat a large meal or eat spicy foods right before bedtime. This can lead to digestive discomfort that can make it hard for you to sleep. Sleep habits  Keep a sleep diary to help you and your health care provider figure out what could be causing your insomnia. Write down: When you sleep. When you wake up during the night. How well you sleep. How rested you feel the next day. Any side effects of medicines you are taking. What you eat and drink. Make your bedroom a dark, comfortable place where it is easy to fall asleep. Put up shades or blackout curtains to block light from outside. Use a white noise machine to block noise. Keep the temperature cool. Limit screen use before bedtime. This includes: Watching TV. Using your smartphone, tablet, or computer. Stick to a routine that includes going to bed and waking up at the same times every day and night. This can help you fall asleep faster. Consider making a  quiet activity, such as reading, part of your nighttime routine. Try to avoid taking naps during the day so that you sleep better at night. Get out of bed if you are still awake after 15 minutes of trying to sleep. Keep the lights down, but try reading or doing a quiet activity. When you feel sleepy, go back to bed. General instructions Take over-the-counter and prescription medicines only as told by your health care provider. Exercise regularly, as told by your health care provider. Avoid exercise starting several hours before bedtime. Use relaxation techniques to manage stress. Ask your health care provider to suggest some techniques that may work well for you. These may include: Breathing exercises. Routines to release muscle tension. Visualizing peaceful scenes. Make sure that you drive carefully. Avoid driving if you feel very sleepy. Keep all follow-up visits as told by your health care provider. This is important. Contact a health care provider if: You are tired throughout the day. You have trouble in your daily routine due to sleepiness. You continue to have sleep problems, or your sleep problems get worse. Get help right away if: You have serious thoughts about hurting yourself or someone else. If you ever feel like you may hurt yourself or others, or have thoughts about taking your own life, get help right away. You can go to your nearest emergency department or call: Your local emergency services (911 in the [place] A suicide crisis helpline, such as the [place] at 215-170-7796. This is open 24 hours a day. Summary Insomnia is a sleep disorder that makes it difficult to fall asleep or stay asleep. Insomnia can be long-term (chronic) or short-term (acute). Treatment for insomnia depends on the cause. Treatment may focus on treating an underlying condition that is causing insomnia. Keep a sleep diary to help you and your health care provider figure out  what could be causing your insomnia. This information is not intended to replace advice given to you by your  health care provider. Make sure you discuss any questions you have with your health care provider. Document Revised: 12/03/2019 Document Reviewed: 12/03/2019 Elsevier Patient Education  2022 [place], Adult Feeling a certain amount of stress is normal. Stress helps our body and mind get ready to deal with the demands of life. Stress hormones can motivate you to do well at work and meet your responsibilities. However severe or long-lasting (chronic) stress can affect your mental and physical health. Chronic stress puts you at higher risk for anxiety, depression, and other health problems like digestive problems, muscle aches, heart disease, high blood pressure, and stroke. What are the causes? Common causes of stress include: Demands from work, such as deadlines, feeling overworked, or having long hours. Pressures at home, such as money issues, disagreements with a spouse, or parenting issues. Pressures from major life changes, such as divorce, moving, loss of a loved one, or chronic illness. You may be at higher risk for stress-related problems if you do not get enough sleep, are in poor health, do not have emotional support, or have a mental health disorder like anxiety or depression. How to recognize stress Stress can make you: Have trouble sleeping. Feel sad, anxious, irritable, or overwhelmed. Lose your appetite. Overeat or want to eat unhealthy foods. Want to use drugs or alcohol. Stress can also cause physical symptoms, such as: Sore, tense muscles, especially in the shoulders and neck. Headaches. Trouble breathing. A faster heart rate. Stomach pain, nausea, or vomiting. Diarrhea or constipation. Trouble concentrating. Follow these instructions at home: Lifestyle Identify the source of your stress and your reaction to it. See a therapist who can  help you change your reactions. When there are stressful events: Talk about it with family, friends, or co-workers. Try to think realistically about stressful events and not ignore them or overreact. Try to find the positives in a stressful situation and not focus on the negatives. Cut back on responsibilities at work and home, if possible. Ask for help from friends or family members if you need it. Find ways to cope with stress, such as: Meditation. Deep breathing. Yoga or tai chi. Progressive muscle relaxation. Doing art, playing music, or reading. Making time for fun activities. Spending time with family and friends. Get support from family, friends, or spiritual resources. Eating and drinking Eat a healthy diet. This includes: Eating foods that are high in fiber, such as beans, whole grains, and fresh fruits and vegetables. Limiting foods that are high in fat and processed sugars, such as fried and sweet foods. Do not skip meals or overeat. Drink enough fluid to keep your urine pale yellow. Alcohol use Do not drink alcohol if: Your health care provider tells you not to drink. You are pregnant, may be pregnant, or are planning to become pregnant. Drinking alcohol is a way some people try to ease their stress. This can be dangerous, so if you drink alcohol: Limit how much you use to: 0-1 drink a day for women. 0-2 drinks a day for men. Be aware of how much alcohol is in your drink. In the [place] one drink equals one 12 oz bottle of beer (355 mL), one 5 oz glass of wine (148 mL), or one 1 oz glass of hard liquor (44 mL). Activity  Include 30 minutes of exercise in your daily schedule. Exercise is a good stress reducer. Include time in your day for an activity that you find relaxing. Try taking a walk, going on a  bike ride, reading a book, or listening to music. Schedule your time in a way that lowers stress, and keep a consistent schedule. Prioritize what is most important to get  done. General instructions Get enough sleep. Try to go to sleep and get up at about the same time every day. Take over-the-counter and prescription medicines only as told by your health care provider. Do not use any products that contain nicotine or tobacco, such as cigarettes, e-cigarettes, and chewing tobacco. If you need help quitting, ask your health care provider. Do not use drugs or smoke to cope with stress. Keep all follow-up visits as told by your health care provider. This is important. Where to find support Talk with your health care provider about stress management or finding a support group. Find a therapist to work with you on your stress management techniques. Contact a health care provider if: Your stress symptoms get worse. You are unable to manage your stress at home. You are struggling to stop using drugs or alcohol. Get help right away if: You may be a danger to yourself or others. You have any thoughts of death or suicide. If you ever feel like you may hurt yourself or others, or have thoughts about taking your own life, get help right away. You can go to your nearest emergency department or call: Your local emergency services (911 in the [place] A suicide crisis helpline, such as the [place] at (802) 547-5157. This is open 24 hours a day. Summary Feeling a certain amount of stress is normal, but severe or long-lasting (chronic) stress can affect your mental and physical health. Chronic stress can put you at higher risk for anxiety, depression, and other health problems like digestive problems, muscle aches, heart disease, high blood pressure, and stroke. You may be at higher risk for stress-related problems if you do not get enough sleep, are in poor health, lack emotional support, or have a mental health disorder like anxiety or depression. Identify the source of your stress and your reaction to it. Try talking about stressful events with  family, friends, or co-workers, finding a coping method, or getting support from spiritual resources. If you need more help, talk with your health care provider about finding a support group or a mental health therapist. This information is not intended to replace advice given to you by your health care provider. Make sure you discuss any questions you have with your health care provider. Document Revised: 04/01/2020 Document Reviewed: 08/20/2018 Elsevier Patient Education  2022 [place],   Merri Ray, MD [place] 11/11/20 2:02 PM

## 2020-11-14 ENCOUNTER — Other Ambulatory Visit: Payer: 59

## 2020-11-14 ENCOUNTER — Other Ambulatory Visit: Payer: Self-pay

## 2020-11-14 LAB — COMPREHENSIVE METABOLIC PANEL
ALT: 17 U/L (ref 0–35)
AST: 14 U/L (ref 0–37)
Albumin: 4 g/dL (ref 3.5–5.2)
Alkaline Phosphatase: 75 U/L (ref 39–117)
BUN: 14 mg/dL (ref 6–23)
CO2: 25 mEq/L (ref 19–32)
Calcium: 8.7 mg/dL (ref 8.4–10.5)
Chloride: 105 mEq/L (ref 96–112)
Creatinine, Ser: 0.74 mg/dL (ref 0.40–1.20)
GFR: 92.65 mL/min (ref >60.00)
Glucose, Bld: 114 mg/dL — ABNORMAL HIGH (ref 70–99)
Potassium: 3.7 mEq/L (ref 3.5–5.1)
Sodium: 139 mEq/L (ref 135–145)
Total Bilirubin: 0.4 mg/dL (ref 0.2–1.2)
Total Protein: 6.3 g/dL (ref 6.0–8.3)

## 2020-11-14 LAB — LIPID PANEL
Cholesterol: 194 mg/dL (ref 0–200)
HDL: 35.3 mg/dL — ABNORMAL LOW (ref >39.00)
NonHDL: 159.17
Total CHOL/HDL Ratio: 6
Triglycerides: 227 mg/dL — ABNORMAL HIGH (ref 0.0–149.0)
VLDL: 45.4 mg/dL — ABNORMAL HIGH (ref 0.0–40.0)

## 2020-11-14 LAB — TSH: TSH: 2 u[IU]/mL (ref 0.35–5.50)

## 2020-11-14 LAB — LDL CHOLESTEROL, DIRECT: Direct LDL: 143 mg/dL

## 2020-12-03 LAB — COLOGUARD: COLOGUARD: NEGATIVE

## 2021-02-17 ENCOUNTER — Telehealth: Payer: Self-pay | Admitting: Family Medicine

## 2021-02-17 NOTE — Telephone Encounter (Signed)
I have placed a order for a mammo in the bin up front

## 2021-02-17 NOTE — Telephone Encounter (Signed)
Placed in your to be signed folder  ?

## 2021-03-02 ENCOUNTER — Telehealth: Payer: Self-pay | Admitting: Family Medicine

## 2021-03-02 NOTE — Telephone Encounter (Signed)
Paper report received from [facility] regarding abnormal mammogram as below.  Call patient to verify receipt of this information.  She did have diagnostic mammogram, ultrasound with possible cysts, plan on repeat imaging in 1 year. Reassured. No further questions. I will watch for report from today's procedure.   FINDINGS:   Mass in the upper central aspect of the left breast, middle third.   The right breast is negative. Addendum  Addendum by Mosetta Pigeon, MD on 02/16/2021  2:38 PM EST  ADDENDUM TO RECOMMENDATIONS: Additional Evaluation   The patient should be recalled for a left breast ultrasound and possible  diagnostic left mammogram.

## 2021-03-08 HISTORY — PX: LIPOSUCTION: SHX10

## 2021-04-06 ENCOUNTER — Other Ambulatory Visit: Payer: Self-pay | Admitting: Cardiology

## 2021-05-03 ENCOUNTER — Emergency Department (HOSPITAL_BASED_OUTPATIENT_CLINIC_OR_DEPARTMENT_OTHER)
Admission: EM | Admit: 2021-05-03 | Discharge: 2021-05-03 | Disposition: A | Discharge status: Home / Self Care | Payer: 59 | Attending: Emergency Medicine | Admitting: Emergency Medicine

## 2021-05-03 ENCOUNTER — Emergency Department (HOSPITAL_BASED_OUTPATIENT_CLINIC_OR_DEPARTMENT_OTHER): Payer: 59 | Admitting: Radiology

## 2021-05-03 ENCOUNTER — Encounter (HOSPITAL_BASED_OUTPATIENT_CLINIC_OR_DEPARTMENT_OTHER): Payer: Self-pay

## 2021-05-03 ENCOUNTER — Other Ambulatory Visit: Payer: Self-pay

## 2021-05-03 DIAGNOSIS — M79644 Pain in right finger(s): Secondary | ICD-10-CM | POA: Diagnosis not present

## 2021-05-03 DIAGNOSIS — S0011XA Contusion of right eyelid and periocular area, initial encounter: Secondary | ICD-10-CM | POA: Diagnosis not present

## 2021-05-03 DIAGNOSIS — W19XXXA Unspecified fall, initial encounter: Secondary | ICD-10-CM

## 2021-05-03 DIAGNOSIS — W01198A Fall on same level from slipping, tripping and stumbling with subsequent striking against other object, initial encounter: Secondary | ICD-10-CM | POA: Insufficient documentation

## 2021-05-03 DIAGNOSIS — S0081XA Abrasion of other part of head, initial encounter: Secondary | ICD-10-CM | POA: Diagnosis not present

## 2021-05-03 DIAGNOSIS — Y92481 Parking lot as the place of occurrence of the external cause: Secondary | ICD-10-CM | POA: Insufficient documentation

## 2021-05-03 DIAGNOSIS — S63286A Dislocation of proximal interphalangeal joint of right little finger, initial encounter: Secondary | ICD-10-CM | POA: Insufficient documentation

## 2021-05-03 DIAGNOSIS — S6991XA Unspecified injury of right wrist, hand and finger(s), initial encounter: Secondary | ICD-10-CM | POA: Diagnosis present

## 2021-05-03 MED ORDER — OXYCODONE-ACETAMINOPHEN 5-325 MG PO TABS: 1.0000 | ORAL_TABLET | Freq: Four times a day (QID) | ORAL | 0 refills | Status: DC | PRN

## 2021-05-03 MED ORDER — LIDOCAINE HCL (PF) 1 % IJ SOLN
10.0000 mL | Freq: Once | INTRAMUSCULAR | Status: AC
  Administered 2021-05-03: 10 mL
  Filled 2021-05-03: qty 10

## 2021-05-03 NOTE — ED Provider Notes (Signed)
?[facility]PT ?Provider Note ? ? ?CSN: [id number] ?Arrival date & time: 05/03/21  1644 ? ?  ? ?History ? ?Chief Complaint  ?Patient presents with  ? Fall  ? ? ?Aimee Maldonado is a 56 y.o. female who presents to the emergency department complaint of a mechanical fall onset prior to arrival.  She notes that she tripped due to a a piece of the sidewalk being elevated.  She notes that she fell and struck her head.  Patient also has right little finger pain with swelling.  Has not tried medication for symptoms.  She notes she is up-to-date with her tetanus.  Denies LOC, vision changes, headache. ? ? ?The history is provided by the patient. No language interpreter was used.  ? ?  ? ?Home Medications ?Prior to Admission medications   ?Medication Sig Start Date End Date Taking? Authorizing Provider  ?buPROPion (WELLBUTRIN XL) 150 MG 24 hr tablet Take 150 mg by mouth at bedtime. 12/16/19   [provider]  ?DULoxetine (CYMBALTA) 30 MG capsule Take 30 mg by mouth daily. Takes 1 tablet daily in addition to 60 mg for a total of 90 mg daily    [provider]  ?DULoxetine (CYMBALTA) 60 MG capsule Take by mouth daily. 12/29/19   [provider]  ?hydrochlorothiazide (HYDRODIURIL) 25 MG tablet Take 1 tablet by mouth once daily 04/07/21   Little Ishikawa, MD  ?metoprolol succinate (TOPROL-XL) 25 MG 24 hr tablet Take 1 tablet (25 mg total) by mouth daily. 11/11/20   Shade Flood, MD  ?   ? ?Allergies    ?Patient has no known allergies.   ? ?Review of Systems   ?Review of Systems  ?Eyes:  Negative for visual disturbance.  ?Musculoskeletal:  Positive for arthralgias and joint swelling.  ?Neurological:  Negative for syncope and headaches.  ?All other systems reviewed and are negative. ? ?Physical Exam ?Updated Vital Signs ?BP (!) 159/93   Pulse 78   Temp 98.4 ?F (36.9 ?C)   Resp 16   LMP 04/26/2021 (Exact Date)   SpO2 100%  ?Physical Exam ?Vitals and nursing note  reviewed.  ?Constitutional:   ?   General: She is not in acute distress. ?   Appearance: She is not diaphoretic.  ?HENT:  ?   Head: Normocephalic and atraumatic.  ?   Comments: Abrasion noted to right cheek, right chin, right head. ?   Mouth/Throat:  ?   Pharynx: No oropharyngeal exudate.  ?Eyes:  ?   General: No scleral icterus. ?   Extraocular Movements: Extraocular movements intact.  ?   Conjunctiva/sclera: Conjunctivae normal.  ?   Pupils: Pupils are equal, round, and reactive to light.  ?   Comments: Ecchymosis noted to right eyelid.  PERRL.  EOMI.  ?Cardiovascular:  ?   Rate and Rhythm: Normal rate and regular rhythm.  ?   Pulses: Normal pulses.  ?   Heart sounds: Normal heart sounds.  ?Pulmonary:  ?   Effort: Pulmonary effort is normal. No respiratory distress.  ?   Breath sounds: Normal breath sounds. No wheezing.  ?Abdominal:  ?   General: Bowel sounds are normal.  ?   Palpations: Abdomen is soft. There is no mass.  ?   Tenderness: There is no abdominal tenderness. There is no guarding or rebound.  ?Musculoskeletal:     ?   General: Normal range of motion.  ?   Cervical back: Normal range of motion and neck supple.  ?  Comments: No C, T, L, S spinal tenderness to palpation.  Strength and sensation intact to bilateral upper and lower extremities.  ?Skin: ?   General: Skin is warm and dry.  ?Neurological:  ?   Mental Status: She is alert.  ?Psychiatric:     ?   Behavior: Behavior normal.  ? ? ?ED Results / Procedures / Treatments   ?Labs ?(all labs ordered are listed, but only abnormal results are displayed) ?Labs Reviewed - No data to display ? ?EKG ?None ? ?Radiology ?DG Hand Complete Right ? ?Result Date: 05/03/2021 ?CLINICAL DATA:  Fall. EXAM: RIGHT HAND - COMPLETE 3+ VIEW COMPARISON:  None. FINDINGS: Dorsal dislocation of the fifth PIP joint. Small avulsion fracture adjacent to the distal aspect of the proximal fifth phalanx on the lateral side. No other fracture or arthropathy. IMPRESSION: Dislocation  and small avulsion fracture of the fifth PIP joint. Electronically Signed   By: Marlan Palau M.D.   On: 05/03/2021 17:36   ? ?Procedures ?Reduction of dislocation ? ?Date/Time: 05/03/2021 9:49 PM ?Performed by: Cyndee Brightly, Marjory Meints A, PA-C ?Authorized by: Chestine Spore A, PA-C  ?Consent: Verbal consent obtained. ?Risks and benefits: risks, benefits and alternatives were discussed ?Consent given by: patient ?Patient understanding: patient states understanding of the procedure being performed ?Imaging studies: imaging studies available ?Patient identity confirmed: verbally with patient ?Local anesthesia used: yes ?Anesthesia: digital block ? ?Anesthesia: ?Local anesthesia used: yes ?Local Anesthetic: lidocaine 1% without epinephrine ?Anesthetic total: 5 mL ?Patient tolerance: patient tolerated the procedure well with no immediate complications ? ?  ? ? ?Medications Ordered in ED ?Medications  ?lidocaine (PF) (XYLOCAINE) 1 % injection 10 mL (10 mLs Infiltration Given 05/03/21 2120)  ? ? ?ED Course/ Medical Decision Making/ A&P ?  ?                        ?Medical Decision Making ?Amount and/or Complexity of Data Reviewed ?Radiology: ordered. ? ?Risk ?Prescription drug management. ? ? ?Pt presents with mechanical fall onset prior to arrival due to tripping over a raised piece of sidewalk.  She notes that she hit her head however did not pass out.  Patient has pain to her right little finger.  Vital signs stable, patient afebrile, not tachycardic or hypoxic.  No focal neurological deficits noted on patient exam.  No acute cardiovascular, respiratory, abdominal spine findings.  On exam patient with decreased flexion of right little finger secondary to pain.  Capillary refill less than 2 seconds.  Sensation intact.  Patient also with ecchymosis noted to right eyelid and abrasion noted to right cheek.  Patient's tetanus was in 2015.  Differential diagnosis includes fracture, dislocation, strain.  ? ? ?Imaging: ?I ordered imaging  studies including right hand x-ray ?I independently visualized and interpreted imaging which showed: Dislocation and small avulsion fracture of the fifth PIP joint. ?Repeat x-rays status post reduction in the ED with results pending at time of signout. ?I agree with the radiologist interpretation ? ? ?Disposition: ?Patient presentation suspicious for dislocation of right little finger with an avulsion fracture.  Finger reduced in the emergency department.  No focal neurological deficits noted on exam, doubt intracranial abnormality at this time. After consideration of the diagnostic results and the patients response to treatment, I feel that the patient would benefit from Discharge home.  We will send patient home with a short course of Percocet.  Patient also given information for on-call orthopedist to call and set up a follow-up  appointment.   ? ? ?Patient case discussed with Dr. Dalene SeltzerSchlossman at sign out. Plan at sign-out is pending splint and repeat xray, likely discharge home. Patient care transferred at sign out.  ? ?This chart was dictated using voice recognition software, Dragon. Despite the best efforts of this provider to proofread and correct errors, errors may still occur which can change documentation meaning. ? ?Final Clinical Impression(s) / ED Diagnoses ?Final diagnoses:  ?Fall, initial encounter  ?Closed traumatic dislocation of proximal interphalangeal (PIP) joint of right little finger  ? ? ?Rx / DC Orders ?ED Discharge Orders   ? ?      Ordered  ?  oxyCODONE-acetaminophen (PERCOCET/ROXICET) 5-325 MG tablet  Every 6 hours PRN       ? 05/03/21 2232  ? ?  ?  ? ?  ? ? ?  ?Mackenzey Crownover A, PA-C ?05/03/21 2233 ? ?  ?Alvira MondaySchlossman, Erin, MD ?05/06/21 1228 ? ?

## 2021-05-03 NOTE — ED Notes (Signed)
Report received and care resumed.  ? ?Rounded on pt. Pt currently resting in bed. Warm blanket provide; pt denies any other needs at this time. Will continue to monitor  ?

## 2021-05-03 NOTE — ED Triage Notes (Signed)
[place]. She has abrasions and edema at right upper face area. She also c/o pain right 5th finger which she is unable to completely extend. She is ambulatory and in no distress. ?

## 2021-05-03 NOTE — Discharge Instructions (Addendum)
It was a pleasure taking care of you today!  ? ?Your x-ray showed a dislocation that was reduced in the ED tonight. Also there was an avulsion fracture to your 5th little finger.  You will be sent a short course of Percocet, take as prescribed.  Attached is information for the on-call orthopedist, call and set up a follow-up appointment regarding today's ED visit.  You may follow-up with your primary care provider as needed.  Return to the emergency department for experiencing increasing/worsening swelling, pain, worsening symptoms. ?

## 2021-05-09 ENCOUNTER — Other Ambulatory Visit: Payer: Self-pay | Admitting: Cardiology

## 2021-05-12 ENCOUNTER — Ambulatory Visit: Payer: 59 | Admitting: Family Medicine

## 2021-05-12 DIAGNOSIS — S63259A Unspecified dislocation of unspecified finger, initial encounter: Secondary | ICD-10-CM | POA: Insufficient documentation

## 2021-05-12 HISTORY — DX: Unspecified dislocation of unspecified finger, initial encounter: S63.259A

## 2021-05-15 ENCOUNTER — Ambulatory Visit: Payer: 59 | Admitting: Family Medicine

## 2021-07-15 ENCOUNTER — Other Ambulatory Visit: Payer: Self-pay | Admitting: Family Medicine

## 2021-07-15 DIAGNOSIS — Z8679 Personal history of other diseases of the circulatory system: Secondary | ICD-10-CM

## 2021-07-15 DIAGNOSIS — I10 Essential (primary) hypertension: Secondary | ICD-10-CM

## 2021-12-14 ENCOUNTER — Other Ambulatory Visit: Payer: Self-pay | Admitting: Family Medicine

## 2021-12-14 DIAGNOSIS — Z8679 Personal history of other diseases of the circulatory system: Secondary | ICD-10-CM

## 2021-12-14 DIAGNOSIS — I10 Essential (primary) hypertension: Secondary | ICD-10-CM

## 2022-03-08 NOTE — Telephone Encounter (Signed)
Accessed to close encounter

## 2022-04-02 LAB — HM MAMMOGRAPHY

## 2022-04-04 ENCOUNTER — Encounter: Payer: Self-pay | Admitting: Family Medicine

## 2022-04-05 ENCOUNTER — Other Ambulatory Visit: Payer: Self-pay

## 2022-04-05 ENCOUNTER — Other Ambulatory Visit: Payer: Self-pay | Admitting: Family Medicine

## 2022-04-05 DIAGNOSIS — I10 Essential (primary) hypertension: Secondary | ICD-10-CM

## 2022-04-05 DIAGNOSIS — Z8679 Personal history of other diseases of the circulatory system: Secondary | ICD-10-CM

## 2022-04-05 MED ORDER — METOPROLOL SUCCINATE ER 25 MG PO TB24: 25.0000 mg | ORAL_TABLET | Freq: Every day | ORAL | 0 refills | Status: DC

## 2022-04-12 ENCOUNTER — Encounter: Payer: Self-pay | Admitting: Family Medicine

## 2022-04-12 LAB — HM MAMMOGRAPHY

## 2022-06-06 ENCOUNTER — Encounter: Payer: Self-pay | Admitting: Family Medicine

## 2022-06-06 ENCOUNTER — Ambulatory Visit (INDEPENDENT_AMBULATORY_CARE_PROVIDER_SITE_OTHER): Payer: 59 | Admitting: Family Medicine

## 2022-06-06 VITALS — BP 122/60 | HR 68 | Temp 98.4°F | Ht 60.0 in | Wt 134.6 lb

## 2022-06-06 DIAGNOSIS — Z131 Encounter for screening for diabetes mellitus: Secondary | ICD-10-CM

## 2022-06-06 DIAGNOSIS — Z8679 Personal history of other diseases of the circulatory system: Secondary | ICD-10-CM

## 2022-06-06 DIAGNOSIS — Z13220 Encounter for screening for lipoid disorders: Secondary | ICD-10-CM | POA: Diagnosis not present

## 2022-06-06 DIAGNOSIS — Z833 Family history of diabetes mellitus: Secondary | ICD-10-CM

## 2022-06-06 DIAGNOSIS — Z124 Encounter for screening for malignant neoplasm of cervix: Secondary | ICD-10-CM | POA: Diagnosis not present

## 2022-06-06 DIAGNOSIS — I10 Essential (primary) hypertension: Secondary | ICD-10-CM | POA: Diagnosis not present

## 2022-06-06 DIAGNOSIS — R5383 Other fatigue: Secondary | ICD-10-CM

## 2022-06-06 DIAGNOSIS — H9191 Unspecified hearing loss, right ear: Secondary | ICD-10-CM | POA: Diagnosis not present

## 2022-06-06 DIAGNOSIS — Z0000 Encounter for general adult medical examination without abnormal findings: Secondary | ICD-10-CM

## 2022-06-06 DIAGNOSIS — Z0001 Encounter for general adult medical examination with abnormal findings: Secondary | ICD-10-CM

## 2022-06-06 LAB — COMPREHENSIVE METABOLIC PANEL
ALT: 18 U/L (ref 0–35)
AST: 17 U/L (ref 0–37)
Albumin: 4.5 g/dL (ref 3.5–5.2)
Alkaline Phosphatase: 101 U/L (ref 39–117)
BUN: 14 mg/dL (ref 6–23)
CO2: 33 mEq/L — ABNORMAL HIGH (ref 19–32)
Calcium: 9.9 mg/dL (ref 8.4–10.5)
Chloride: 95 mEq/L — ABNORMAL LOW (ref 96–112)
Creatinine, Ser: 0.83 mg/dL (ref 0.40–1.20)
GFR: 79.85 mL/min (ref >60.00)
Glucose, Bld: 91 mg/dL (ref 70–99)
Potassium: 4 mEq/L (ref 3.5–5.1)
Sodium: 137 mEq/L (ref 135–145)
Total Bilirubin: 0.3 mg/dL (ref 0.2–1.2)
Total Protein: 7.4 g/dL (ref 6.0–8.3)

## 2022-06-06 LAB — CBC
HCT: 46.3 % — ABNORMAL HIGH (ref 36.0–46.0)
Hemoglobin: 15.5 g/dL — ABNORMAL HIGH (ref 12.0–15.0)
MCHC: 33.6 g/dL (ref 30.0–36.0)
MCV: 84.2 fl (ref 78.0–100.0)
Platelets: 338 10*3/uL (ref 150.0–400.0)
RBC: 5.5 Mil/uL — ABNORMAL HIGH (ref 3.87–5.11)
RDW: 14.3 % (ref 11.5–15.5)
WBC: 8.3 10*3/uL (ref 4.0–10.5)

## 2022-06-06 LAB — LIPID PANEL
Cholesterol: 179 mg/dL (ref 0–200)
HDL: 44.4 mg/dL (ref >39.00)
NonHDL: 134.91
Total CHOL/HDL Ratio: 4
Triglycerides: 249 mg/dL — ABNORMAL HIGH (ref 0.0–149.0)
VLDL: 49.8 mg/dL — ABNORMAL HIGH (ref 0.0–40.0)

## 2022-06-06 LAB — HEMOGLOBIN A1C: Hgb A1c MFr Bld: 5.6 % (ref 4.6–6.5)

## 2022-06-06 LAB — LDL CHOLESTEROL, DIRECT: Direct LDL: 125 mg/dL

## 2022-06-06 LAB — TSH: TSH: 0.93 u[IU]/mL (ref 0.35–5.50)

## 2022-06-06 MED ORDER — HYDROCHLOROTHIAZIDE 25 MG PO TABS: 25.0000 mg | ORAL_TABLET | Freq: Every day | ORAL | 1 refills | Status: DC

## 2022-06-06 MED ORDER — METOPROLOL SUCCINATE ER 50 MG PO TB24: 50.0000 mg | ORAL_TABLET | Freq: Every day | ORAL | 1 refills | Status: DC

## 2022-06-06 NOTE — Patient Instructions (Addendum)
Okay to stay on the Toprol 50 mg dose for now but that can cause some fatigue.  You may want to discuss this with your psychiatrist to see if new medication may be causing that or we can try to switch her blood pressure medications.  Follow-up with me in the next month and if fatigue continues we can look at possible changes.  Follow-up in 1 month, to see how fatigue and new medication is working at that time.  I did check some labs today for screening as well as fatigue.  I also referred you to gynecology for cervical cancer screening and ear nose and throat specialist for the hearing changes.  They should be calling you to provide the specific name of whom you would like to see.  Thanks for coming in today and take care!  Preventive Care 55-22 Years Old, Female Preventive care refers to lifestyle choices and visits with your health care provider that can promote health and wellness. Preventive care visits are also called wellness exams. What can I expect for my preventive care visit? Counseling Your health care provider may ask you questions about your: Medical history, including: Past medical problems. Family medical history. Pregnancy history. Current health, including: Menstrual cycle. Method of birth control. Emotional well-being. Home life and relationship well-being. Sexual activity and sexual health. Lifestyle, including: Alcohol, nicotine or tobacco, and drug use. Access to firearms. Diet, exercise, and sleep habits. Work and work Astronomer. Sunscreen use. Safety issues such as seatbelt and bike helmet use. Physical exam Your health care provider will check your: Height and weight. These may be used to calculate your BMI (body mass index). BMI is a measurement that tells if you are at a healthy weight. Waist circumference. This measures the distance around your waistline. This measurement also tells if you are at a healthy weight and may help predict your risk of certain  diseases, such as type 2 diabetes and high blood pressure. Heart rate and blood pressure. Body temperature. Skin for abnormal spots. What immunizations do I need?  Vaccines are usually given at various ages, according to a schedule. Your health care provider will recommend vaccines for you based on your age, medical history, and lifestyle or other factors, such as travel or where you work. What tests do I need? Screening Your health care provider may recommend screening tests for certain conditions. This may include: Lipid and cholesterol levels. Diabetes screening. This is done by checking your blood sugar (glucose) after you have not eaten for a while (fasting). Pelvic exam and Pap test. Hepatitis B test. Hepatitis C test. HIV (human immunodeficiency virus) test. STI (sexually transmitted infection) testing, if you are at risk. Lung cancer screening. Colorectal cancer screening. Mammogram. Talk with your health care provider about when you should start having regular mammograms. This may depend on whether you have a family history of breast cancer. BRCA-related cancer screening. This may be done if you have a family history of breast, ovarian, tubal, or peritoneal cancers. Bone density scan. This is done to screen for osteoporosis. Talk with your health care provider about your test results, treatment options, and if necessary, the need for more tests. Follow these instructions at home: Eating and drinking  Eat a diet that includes fresh fruits and vegetables, whole grains, lean protein, and low-fat dairy products. Take vitamin and mineral supplements as recommended by your health care provider. Do not drink alcohol if: Your health care provider tells you not to drink. You are pregnant, may  be pregnant, or are planning to become pregnant. If you drink alcohol: Limit how much you have to 0-1 drink a day. Know how much alcohol is in your drink. In the [place] one drink equals one 12 oz  bottle of beer (355 mL), one 5 oz glass of wine (148 mL), or one 1 oz glass of hard liquor (44 mL). Lifestyle Brush your teeth every morning and night with fluoride toothpaste. Floss one time each day. Exercise for at least 30 minutes 5 or more days each week. Do not use any products that contain nicotine or tobacco. These products include cigarettes, chewing tobacco, and vaping devices, such as e-cigarettes. If you need help quitting, ask your health care provider. Do not use drugs. If you are sexually active, practice safe sex. Use a condom or other form of protection to prevent STIs. If you do not wish to become pregnant, use a form of birth control. If you plan to become pregnant, see your health care provider for a prepregnancy visit. Take aspirin only as told by your health care provider. Make sure that you understand how much to take and what form to take. Work with your health care provider to find out whether it is safe and beneficial for you to take aspirin daily. Find healthy ways to manage stress, such as: Meditation, yoga, or listening to music. Journaling. Talking to a trusted person. Spending time with friends and family. Minimize exposure to UV radiation to reduce your risk of skin cancer. Safety Always wear your seat belt while driving or riding in a vehicle. Do not drive: If you have been drinking alcohol. Do not ride with someone who has been drinking. When you are tired or distracted. While texting. If you have been using any mind-altering substances or drugs. Wear a helmet and other protective equipment during sports activities. If you have firearms in your house, make sure you follow all gun safety procedures. Seek help if you have been physically or sexually abused. What's next? Visit your health care provider once a year for an annual wellness visit. Ask your health care provider how often you should have your eyes and teeth checked. Stay up to date on all  vaccines. This information is not intended to replace advice given to you by your health care provider. Make sure you discuss any questions you have with your health care provider. Document Revised: 07/20/2020 Document Reviewed: 07/20/2020 Elsevier Patient Education  2023 [employer].

## 2022-06-06 NOTE — Progress Notes (Signed)
Subjective:  Patient ID: Aimee Maldonado, female    DOB: 08-Dec-1965  Age: 57 y.o. MRN: [id number]  CC:  Chief Complaint  Patient presents with   Annual Exam    Pt wants to address weight loss if permitted otherwise okay with regular physical     HPI Aimee Maldonado presents for Annual Exam.  Last visit with me in 2022.  Treated by hand specialist, Dr. Melvyn Novas last year for finger dislocation. 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   Hypertension: Hydrochlorothiazide 25 mg daily, Toprol-XL 25 mg daily.  Cardiologist, Dr. Bjorn Pippin.  History of SVT.  Last visit June 2022.  Continued Toprol-XL 25 mg daily. Ran out of her meds, took mother's HCTZ -same dose, toprol 50mg . Some fatigue - not sure if it is from toprol or new depression meds. Home readings: 168/106. Prior to 50mg  dose toprol (off betablocker altogether). Had some elevated BP prior on 25mg  toprol.  BP Readings from Last 3 Encounters:  06/06/22 122/60  05/03/21 (!) 144/97  11/11/20 118/62   Lab Results  Component Value Date   CREATININE 0.74 11/11/2020   R ear hearing loss Some dec on left, more on right, longstanding with prior infections. No recent ENT eval, seems worse past few years.   Depression Depression with situational stressors discussed in 2022.  Has been followed by psychiatry, and previously treated with Cymbalta, Wellbutrin.  Currently on Cymbalta 120 mg daily (60mg  BID), Auvelity 45/105 mg, alprazolam as needed.  Trazodone 50 mg 1-2 at bedtime.  Alprazolam #30 last filled 04/09/2022, previously 09/12/2021. [facility] - recently started on Auvelity 1 week ago. Marland Kitchen Possible fatigue with this med.  Remote thoughts of suicide few months ago. No attempts, no recent SI, new meds helping.     06/06/2022    1:11 PM 11/11/2020   10:19 AM 08/11/2020    1:43 PM  GAD 7 : Generalized Anxiety Score  Nervous, Anxious, on Edge 1 2 1   Control/stop worrying 1 1 0  Worry too  much - different things 1 1 1   Trouble relaxing 1 0 1  Restless 0 1 0  Easily annoyed or irritable 1 1 0  Afraid - awful might happen 1 0 0  Total GAD 7 Score 6 6 3   Anxiety Difficulty   Not difficult at all        06/06/2022    1:09 PM 11/11/2020   10:19 AM 08/11/2020    1:41 PM 01/19/2020    1:16 PM  Depression screen PHQ 2/9  Decreased Interest 2 1 1  0  Down, Depressed, Hopeless 2 1 1 1   PHQ - 2 Score 4 2 2 1   Altered sleeping 2 3 2  0  Tired, decreased energy 3 3 2 1   Change in appetite 1 1 3  0  Feeling bad or failure about yourself  1 1 2  0  Trouble concentrating 1 0 0 0  Moving slowly or fidgety/restless 0 0 0 0  Suicidal thoughts 2 0 0 0  PHQ-9 Score 14 10 11 2   Difficult doing work/chores Very difficult  Not difficult at all Somewhat difficult    Health Maintenance  Topic Date Due   HIV Screening  Never done   Hepatitis C Screening  Never done   Zoster Vaccines- Shingrix (1 of 2) Never done   PAP SMEAR-Modifier  Never done   COLONOSCOPY (Pts 45-36yrs Insurance coverage will need to be confirmed)  Never done  COVID-19 Vaccine (2 - Janssen risk series) 07/02/2019   INFLUENZA VACCINE  09/06/2022   DTaP/Tdap/Td (2 - Td or Tdap) 12/05/2023   MAMMOGRAM  04/11/2024   HPV VACCINES  Aged Out  Cologuard negative 11/2020.  Mammogram: 04/12/22.  Due for pap testing. Prior normal. No current gynecologist. Prefers to see GYN.    Immunization History  Administered Date(s) Administered   Influenza,inj,Quad PF,6+ Mos 10/31/2015   Janssen (J&J) SARS-COV-2 Vaccination 06/04/2019   Tdap 12/04/2013   Varicella 02/06/2016  Covid booster - declined.  Flu vaccine - recommended in fall.   No results found. Optho - upcoming appt and up to date.   Dental:Within Last 6 months brother in [place].   Alcohol: none  Tobacco: none  Exercise: Body mass index is 26.29 kg/m. Trying to lose weight.family history of HTN, DM.  Took family member's extra ozempic  - used 0.25mg  q wk for  4 weeks, then 0.5mg  q week for 4 weeks. Last dose mid April. lost 15#.  Sometimes dizzy with new med.  No regular exercise at this time. Active during day.  Fast food - 2 times per week.  Diet soda.  No results found for: "HGBA1C"   History Patient Active Problem List   Diagnosis Date Noted   SVT (supraventricular tachycardia) 08/11/2020   Past Medical History:  Diagnosis Date   Anxiety    Arthritis    Atrial fibrillation    Blood transfusion without reported diagnosis    Depression    Hypertension    Past Surgical History:  Procedure Laterality Date   CESAREAN SECTION  05/2009   LIPOSUCTION  03/2021   [place]   msc[place]igage     No Known Allergies Prior to Admission medications   Medication Sig Start Date End Date Taking? Authorizing Provider  DULoxetine (CYMBALTA) 60 MG capsule Take by mouth daily. 12/29/19  Yes [provider]  hydrochlorothiazide (HYDRODIURIL) 25 MG tablet Take 1 tablet by mouth once daily 05/09/21  Yes Little Ishikawa, MD  metoprolol succinate (TOPROL-XL) 25 MG 24 hr tablet Take 1 tablet (25 mg total) by mouth daily. 04/05/22  Yes Shade Flood, MD  buPROPion (WELLBUTRIN XL) 150 MG 24 hr tablet Take 150 mg by mouth at bedtime. Patient not taking: Reported on 06/06/2022 12/16/19   [provider]  DULoxetine (CYMBALTA) 30 MG capsule Take 30 mg by mouth daily. Takes 1 tablet daily in addition to 60 mg for a total of 90 mg daily Patient not taking: Reported on 06/06/2022    [provider]  oxyCODONE-acetaminophen (PERCOCET/ROXICET) 5-325 MG tablet Take 1 tablet by mouth every 6 (six) hours as needed for severe pain. Patient not taking: Reported on 06/06/2022 05/03/21   Blue, Soijett A, PA-C   Social History   Socioeconomic History   Marital status: Married    Spouse name: Not on file   Number of children: Not on file   Years of education: Not on file   Highest education level: Not on file  Occupational History   Not  on file  Tobacco Use   Smoking status: Never   Smokeless tobacco: Never  Vaping Use   Vaping Use: Never used  Substance and Sexual Activity   Alcohol use: No   Drug use: No   Sexual activity: Yes    Birth control/protection: None  Other Topics Concern   Not on file  Social History Narrative   Not on file   Social Determinants of Health   Financial Resource  Strain: Not on file  Food Insecurity: Not on file  Transportation Needs: Not on file  Physical Activity: Not on file  Stress: Not on file  Social Connections: Not on file  Intimate Partner Violence: Not on file    Review of Systems 13 point review of systems per patient health survey noted.  Negative other than as indicated above or in HPI.    Objective:   Vitals:   06/06/22 1317  BP: 122/60  Pulse: 68  Temp: 98.4 F (36.9 C)  TempSrc: Temporal  SpO2: 98%  Weight: 134 lb 9.6 oz (61.1 kg)  Height: 5' (1.524 m)     Physical Exam Constitutional:      Appearance: She is well-developed.  HENT:     Head: Normocephalic and atraumatic.     Right Ear: Tympanic membrane, ear canal and external ear normal.     Left Ear: Tympanic membrane, ear canal and external ear normal.     Ears:     Comments: No apparent erythema or retraction of canals, no effusions appreciated.  Eyes:     Conjunctiva/sclera: Conjunctivae normal.     Pupils: Pupils are equal, round, and reactive to light.  Neck:     Thyroid: No thyromegaly.  Cardiovascular:     Rate and Rhythm: Normal rate and regular rhythm.     Heart sounds: Normal heart sounds. No murmur heard. Pulmonary:     Effort: Pulmonary effort is normal. No respiratory distress.     Breath sounds: Normal breath sounds. No wheezing.  Abdominal:     General: Bowel sounds are normal.     Palpations: Abdomen is soft.     Tenderness: There is no abdominal tenderness.  Musculoskeletal:        General: No tenderness. Normal range of motion.     Cervical back: Normal range of  motion and neck supple.  Lymphadenopathy:     Cervical: No cervical adenopathy.  Skin:    General: Skin is warm and dry.     Findings: No rash.  Neurological:     Mental Status: She is alert and oriented to person, place, and time.  Psychiatric:        Behavior: Behavior normal.        Thought Content: Thought content normal.    Assessment & Plan:  Darhonda Olivia is a 57 y.o. female . Annual physical exam  - -anticipatory guidance as below in AVS, screening labs above. Health maintenance items as above in HPI discussed/recommended as applicable.   Screening for cervical cancer - Plan: Ambulatory referral to Gynecology  Hearing loss of right ear, unspecified hearing loss type - Plan: Ambulatory referral to ENT  -Bilateral hearing loss, reports slow but progressive worsening in right, refer to ENT, likely audiologist to evaluate as well.  Primary hypertension - Plan: metoprolol succinate (TOPROL-XL) 50 MG 24 hr tablet, hydrochlorothiazide (HYDRODIURIL) 25 MG tablet  -Fatigue, but not sure if that could be from her beta-blocker or psychiatric meds.  New medication started recently.  Check labs for fatigue, no other med changes for now, follow-up in 1 month but option to decrease Toprol in the interim if persistent.  RTC/ER precautions.  Family history of diabetes mellitus - Plan: Hemoglobin A1c  Screening for diabetes mellitus - Plan: Hemoglobin A1c  Screening for hyperlipidemia - Plan: Lipid panel  Fatigue, unspecified type - Plan: Comprehensive metabolic panel, TSH, CBC History of atrial fibrillation  -As above, fatigue could be due to new meds from psychiatry.  Continue current regimen with close follow-up if persistent fatigue, consider adjustment in beta-blocker.  Check labs above.  Briefly discussed use of medications for weight loss.  Based on current BMI I am not sure if she would have coverage for those but happy to discuss this further at next visit.  Recommended against  use of medications without prescription.  Diet/activity/exercise approach discussed but with current fatigue that may be somewhat difficult.  Will discuss further at follow-up visit.  Meds ordered this encounter  Medications   metoprolol succinate (TOPROL-XL) 50 MG 24 hr tablet    Sig: Take 1 tablet (50 mg total) by mouth daily. Take with or immediately following a meal.    Dispense:  90 tablet    Refill:  1   hydrochlorothiazide (HYDRODIURIL) 25 MG tablet    Sig: Take 1 tablet (25 mg total) by mouth daily.    Dispense:  90 tablet    Refill:  1   Patient Instructions  Okay to stay on the Toprol 50 mg dose for now but that can cause some fatigue.  You may want to discuss this with your psychiatrist to see if new medication may be causing that or we can try to switch her blood pressure medications.  Follow-up with me in the next month and if fatigue continues we can look at possible changes.  Follow-up in 1 month, to see how fatigue and new medication is working at that time.  I did check some labs today for screening as well as fatigue.  I also referred you to gynecology for cervical cancer screening and ear nose and throat specialist for the hearing changes.  They should be calling you to provide the specific name of whom you would like to see.  Thanks for coming in today and take care!  Preventive Care 70-67 Years Old, Female Preventive care refers to lifestyle choices and visits with your health care provider that can promote health and wellness. Preventive care visits are also called wellness exams. What can I expect for my preventive care visit? Counseling Your health care provider may ask you questions about your: Medical history, including: Past medical problems. Family medical history. Pregnancy history. Current health, including: Menstrual cycle. Method of birth control. Emotional well-being. Home life and relationship well-being. Sexual activity and sexual  health. Lifestyle, including: Alcohol, nicotine or tobacco, and drug use. Access to firearms. Diet, exercise, and sleep habits. Work and work Astronomer. Sunscreen use. Safety issues such as seatbelt and bike helmet use. Physical exam Your health care provider will check your: Height and weight. These may be used to calculate your BMI (body mass index). BMI is a measurement that tells if you are at a healthy weight. Waist circumference. This measures the distance around your waistline. This measurement also tells if you are at a healthy weight and may help predict your risk of certain diseases, such as type 2 diabetes and high blood pressure. Heart rate and blood pressure. Body temperature. Skin for abnormal spots. What immunizations do I need?  Vaccines are usually given at various ages, according to a schedule. Your health care provider will recommend vaccines for you based on your age, medical history, and lifestyle or other factors, such as travel or where you work. What tests do I need? Screening Your health care provider may recommend screening tests for certain conditions. This may include: Lipid and cholesterol levels. Diabetes screening. This is done by checking your blood sugar (glucose) after you have not eaten for a  while (fasting). Pelvic exam and Pap test. Hepatitis B test. Hepatitis C test. HIV (human immunodeficiency virus) test. STI (sexually transmitted infection) testing, if you are at risk. Lung cancer screening. Colorectal cancer screening. Mammogram. Talk with your health care provider about when you should start having regular mammograms. This may depend on whether you have a family history of breast cancer. BRCA-related cancer screening. This may be done if you have a family history of breast, ovarian, tubal, or peritoneal cancers. Bone density scan. This is done to screen for osteoporosis. Talk with your health care provider about your test results,  treatment options, and if necessary, the need for more tests. Follow these instructions at home: Eating and drinking  Eat a diet that includes fresh fruits and vegetables, whole grains, lean protein, and low-fat dairy products. Take vitamin and mineral supplements as recommended by your health care provider. Do not drink alcohol if: Your health care provider tells you not to drink. You are pregnant, may be pregnant, or are planning to become pregnant. If you drink alcohol: Limit how much you have to 0-1 drink a day. Know how much alcohol is in your drink. In the [place] one drink equals one 12 oz bottle of beer (355 mL), one 5 oz glass of wine (148 mL), or one 1 oz glass of hard liquor (44 mL). Lifestyle Brush your teeth every morning and night with fluoride toothpaste. Floss one time each day. Exercise for at least 30 minutes 5 or more days each week. Do not use any products that contain nicotine or tobacco. These products include cigarettes, chewing tobacco, and vaping devices, such as e-cigarettes. If you need help quitting, ask your health care provider. Do not use drugs. If you are sexually active, practice safe sex. Use a condom or other form of protection to prevent STIs. If you do not wish to become pregnant, use a form of birth control. If you plan to become pregnant, see your health care provider for a prepregnancy visit. Take aspirin only as told by your health care provider. Make sure that you understand how much to take and what form to take. Work with your health care provider to find out whether it is safe and beneficial for you to take aspirin daily. Find healthy ways to manage stress, such as: Meditation, yoga, or listening to music. Journaling. Talking to a trusted person. Spending time with friends and family. Minimize exposure to UV radiation to reduce your risk of skin cancer. Safety Always wear your seat belt while driving or riding in a vehicle. Do not drive: If you  have been drinking alcohol. Do not ride with someone who has been drinking. When you are tired or distracted. While texting. If you have been using any mind-altering substances or drugs. Wear a helmet and other protective equipment during sports activities. If you have firearms in your house, make sure you follow all gun safety procedures. Seek help if you have been physically or sexually abused. What's next? Visit your health care provider once a year for an annual wellness visit. Ask your health care provider how often you should have your eyes and teeth checked. Stay up to date on all vaccines. This information is not intended to replace advice given to you by your health care provider. Make sure you discuss any questions you have with your health care provider. Document Revised: 07/20/2020 Document Reviewed: 07/20/2020 Elsevier Patient Education  2023 [employer].     Signed,   Meredith Staggers, MD  [facility], [facility] 06/06/22 1:50 PM

## 2022-06-12 ENCOUNTER — Other Ambulatory Visit: Payer: Self-pay | Admitting: Family Medicine

## 2022-06-12 DIAGNOSIS — D582 Other hemoglobinopathies: Secondary | ICD-10-CM

## 2022-06-12 NOTE — Progress Notes (Signed)
Elevated HGB. Lab only visit.

## 2022-06-14 ENCOUNTER — Telehealth: Payer: Self-pay | Admitting: Family Medicine

## 2022-06-14 NOTE — Telephone Encounter (Signed)
Dr Leland Johns in [place] with [facility]. [phone]  Can we place referral for pt ?

## 2022-06-14 NOTE — Telephone Encounter (Signed)
Caller name: Zevaeh Adcox  On DPR?: Yes  Call back number: [phone] (mobile)  Provider they see: Shade Flood, MD  Reason for call: Pt would to let us know what ENT doctor she would like to use. Dr Leland Johns in [place] with [facility]. [phone]

## 2022-06-15 NOTE — Telephone Encounter (Signed)
Referral has been sent to   Dr Leland Johns in [place] with [facility]. [phone]  Pt sent a mychart letter

## 2022-06-29 ENCOUNTER — Telehealth: Payer: Self-pay | Admitting: Family Medicine

## 2022-06-29 NOTE — Telephone Encounter (Signed)
Is it ok to place referral for mammogram for pt ?

## 2022-06-29 NOTE — Telephone Encounter (Signed)
Patient called and would like a referral for a mamo and would like to get it done prior to her appt with Dr. Chilton Si on 6.5

## 2022-07-01 NOTE — Telephone Encounter (Signed)
It looks like she had imaging in March with benign findings, with 1 year follow up recommended. Can discuss further at her upcoming appt, but does not appear to be due yet.

## 2022-07-03 NOTE — Telephone Encounter (Signed)
I have reviewed this message with the pt and she expressed verbal understanding

## 2022-07-11 ENCOUNTER — Ambulatory Visit: Payer: 59 | Admitting: Family Medicine

## 2022-07-11 VITALS — BP 122/66 | HR 85 | Temp 97.9°F | Ht 60.0 in | Wt 135.6 lb

## 2022-07-11 DIAGNOSIS — N6002 Solitary cyst of left breast: Secondary | ICD-10-CM | POA: Diagnosis not present

## 2022-07-11 DIAGNOSIS — R5383 Other fatigue: Secondary | ICD-10-CM

## 2022-07-11 DIAGNOSIS — R635 Abnormal weight gain: Secondary | ICD-10-CM | POA: Diagnosis not present

## 2022-07-11 DIAGNOSIS — D582 Other hemoglobinopathies: Secondary | ICD-10-CM | POA: Diagnosis not present

## 2022-07-11 NOTE — Progress Notes (Signed)
Subjective:  Patient ID: Aimee Maldonado, female    DOB: 1965-10-25  Age: 57 y.o. MRN: [id number]  CC:  Chief Complaint  Patient presents with   Weight Loss    Pt notes struggling to lose weight has been trying to walk to assist in losing weight but notes she has gained some    Fatigue    Pt notes fatigue seems a bit better but not the best it could be noted an improvement with change in antidepressant     HPI [name] presents for  Follow-up from 06/06/2022 visit.   Difficulty with weight loss Briefly discussed last visit.  Has been trying to walk to assist in losing weight with some improvement.  Initial diet/exercise approach recommended based on current BMI.  Deferred medications.  Walking sporadically 1-2 times per week, 20-30 min. Working on trying to increase exercise. Trying to eat healthier. Yogurt or cereal in am. Salads, chicken sandwiches, veggies. Grilled food.  Avoiding sugar beverages and alcohol.  Going through menopause.  Has met with weight loss specialist in past, no current specialist.  Her goal BMI would be lower - gaol of weight in 115 range.  Has taken weight loss meds that have worked for her - not prescribed. She may take meds by other provider if possible. Wants to see weight come off fast for it to be sustainable.  Declines number for Healthy Weight and Wellness.  Appt with GYN on 6/17 - not sure how she feels about hormone replacement - encouraged to discuss at that visit.   Lab Results  Component Value Date   HGBA1C 5.6 06/06/2022    Body mass index is 26.48 kg/m. Wt Readings from Last 3 Encounters:  07/11/22 135 lb 9.6 oz (61.5 kg)  06/06/22 134 lb 9.6 oz (61.1 kg)  11/11/20 151 lb (68.5 kg)    Depression/fatigue Have not discussed since 2022 at her previous visit.  Followed by psychiatry and had been started on new medication 1 week prior, thought that fatigue may be in part related to that medication, but she is also taking a  beta-blocker.Heart rate 68 last visit, no med changes. Hemoglobin was actually on the higher side at 15.5, normal creatinine, sodium, potassium, and TSH last visit. Fatigue has somewhat improved since last visit, possibly related to new antidepressant. New med is working really well. Doing much better and less fatigue.   Breast lump:  Bilateral screening mammogram on 04/02/2022 noted.  Possible architectural distortion adjacent to the previously characterized cyst in the left breast.  Recommended diagnostic left mammogram and possible left breast ultrasound. Left breast mammogram on 04/12/2022, no suspicious masses or calcifications in the left breast.  Previously noted area of concern disperses with compression.  No mammographic evidence of malignancy, scattered fibroglandular density, and benign findings with recommended routine follow-up.   New concern - felt bump in upper part of left breast 1 month ago. Hard and slightly sore, but seems to be getting better, no redness/skin changes, no nipple d/c.     History Patient Active Problem List   Diagnosis Date Noted   SVT (supraventricular tachycardia) 08/11/2020   Past Medical History:  Diagnosis Date   Anxiety    Arthritis    Atrial fibrillation    Blood transfusion without reported diagnosis    Depression    Hypertension    Past Surgical History:  Procedure Laterality Date   CESAREAN SECTION  05/2009   LIPOSUCTION  03/2021   [place]   mscarrigage  No Known Allergies Prior to Admission medications   Medication Sig Start Date End Date Taking? Authorizing Provider  ALPRAZolam Prudy Feeler) 0.25 MG tablet Take 1 tablet by mouth daily as needed.   Yes [provider]  AUVELITY 45-105 MG TBCR Take 1 tablet by mouth daily. 05/17/22  Yes [provider]  DULoxetine (CYMBALTA) 60 MG capsule Take by mouth daily. 12/29/19  Yes [provider]  hydrochlorothiazide (HYDRODIURIL) 25 MG tablet Take 1 tablet (25 mg  total) by mouth daily. 06/06/22  Yes Shade Flood, MD  metoprolol succinate (TOPROL-XL) 50 MG 24 hr tablet Take 1 tablet (50 mg total) by mouth daily. Take with or immediately following a meal. 06/06/22  Yes Shade Flood, MD  traZODone (DESYREL) 50 MG tablet Take 1-2 tablets by mouth at bedtime.   Yes [provider]   Social History   Socioeconomic History   Marital status: Married    Spouse name: Not on file   Number of children: Not on file   Years of education: Not on file   Highest education level: Not on file  Occupational History   Not on file  Tobacco Use   Smoking status: Never   Smokeless tobacco: Never  Vaping Use   Vaping Use: Never used  Substance and Sexual Activity   Alcohol use: No   Drug use: No   Sexual activity: Yes    Birth control/protection: None  Other Topics Concern   Not on file  Social History Narrative   Not on file   Social Determinants of Health   Financial Resource Strain: Not on file  Food Insecurity: Not on file  Transportation Needs: Not on file  Physical Activity: Not on file  Stress: Not on file  Social Connections: Not on file  Intimate Partner Violence: Not on file    Review of Systems Per HPI.   Objective:   Vitals:   07/11/22 1306  BP: 122/66  Pulse: 85  Temp: 97.9 F (36.6 C)  TempSrc: Temporal  SpO2: 99%  Weight: 135 lb 9.6 oz (61.5 kg)  Height: 5' (1.524 m)     Physical Exam Vitals reviewed. Exam conducted with a chaperone present Thea Silversmith).  Constitutional:      General: She is not in acute distress.    Appearance: Normal appearance. She is well-developed.  HENT:     Head: Normocephalic and atraumatic.  Cardiovascular:     Rate and Rhythm: Normal rate.  Pulmonary:     Effort: Pulmonary effort is normal.  Chest:     Chest wall: No mass, deformity, swelling or tenderness.  Breasts:    Left: No swelling, bleeding, mass, nipple discharge, skin change or tenderness.  Lymphadenopathy:      Upper Body:     Left upper body: No axillary adenopathy.  Neurological:     Mental Status: She is alert and oriented to person, place, and time.  Psychiatric:        Mood and Affect: Mood normal.        Assessment & Plan:  Aimee Maldonado is a 57 y.o. female . Fatigue, unspecified type  -Overall reassuring labs from last visit, fatigue has improved with new antidepressant, continue same.  RTC precautions  Weight increase  -Commended on exercise, recommended she discuss hormones/menopause impact on weight and treatment options with upcoming appointment with gynecology.  Declines referral to weight management specialist, plans on medication elsewhere.  Elevated hemoglobin (HCC) - Plan: CBC  -Borderline prior, repeat CBC  Breast cyst, left  -Prior cyst, imaging earlier this year reassuring.  Reports area on left side has improved and I do not appreciate any mass, nodule or abnormality on exam.  RTC precautions discussed and did recommend imaging repeat if any persistent or new symptoms.  Understanding expressed.  No orders of the defined types were placed in this encounter.  Patient Instructions  Thanks for coming in today. Keep up the good work with exercise - goal of per week and continue healthy eating.  Visit with gynecology may also be a good time to discuss the questions about hormones and other menopausal symptoms.  I will repeat your blood count today.  Hemoglobin was up a little bit last time but not concerning.  I do not feel any concerns on the breast exam today and I am encouraged by your history of that improving and lessening in size.  I think it is reasonable to watch that for a few more weeks and if any persistent symptoms, or you do notice a bump again I would recommend imaging with diagnostic mammogram and ultrasound likely again.  That was overall reassuring in March.  Please let me know if symptoms do not completely resolve.  Take care.     Signed,    Meredith Staggers, MD [facility], [facility] 07/11/22 1:27 PM

## 2022-07-11 NOTE — Patient Instructions (Addendum)
Thanks for coming in today. Keep up the good work with exercise - goal of per week and continue healthy eating.  Visit with gynecology may also be a good time to discuss the questions about hormones and other menopausal symptoms.  I will repeat your blood count today.  Hemoglobin was up a little bit last time but not concerning.  I do not feel any concerns on the breast exam today and I am encouraged by your history of that improving and lessening in size.  I think it is reasonable to watch that for a few more weeks and if any persistent symptoms, or you do notice a bump again I would recommend imaging with diagnostic mammogram and ultrasound likely again.  That was overall reassuring in March.  Please let me know if symptoms do not completely resolve.  Take care.

## 2022-07-12 LAB — CBC
HCT: 45.2 % (ref 36.0–46.0)
Hemoglobin: 14.6 g/dL (ref 12.0–15.0)
MCHC: 32.2 g/dL (ref 30.0–36.0)
MCV: 87.3 fl (ref 78.0–100.0)
Platelets: 293 10*3/uL (ref 150.0–400.0)
RBC: 5.18 Mil/uL — ABNORMAL HIGH (ref 3.87–5.11)
RDW: 14.7 % (ref 11.5–15.5)
WBC: 10.1 10*3/uL (ref 4.0–10.5)

## 2022-07-13 ENCOUNTER — Encounter: Payer: Self-pay | Admitting: Family Medicine

## 2022-08-14 ENCOUNTER — Encounter: Payer: Self-pay | Admitting: Radiology

## 2022-08-14 ENCOUNTER — Other Ambulatory Visit (HOSPITAL_COMMUNITY)
Admission: RE | Admit: 2022-08-14 | Discharge: 2022-08-14 | Disposition: A | Discharge status: Home / Self Care | Payer: 59 | Source: Ambulatory Visit | Attending: Radiology | Admitting: Radiology

## 2022-08-14 ENCOUNTER — Ambulatory Visit (INDEPENDENT_AMBULATORY_CARE_PROVIDER_SITE_OTHER): Payer: 59 | Admitting: Radiology

## 2022-08-14 VITALS — BP 132/88 | Ht 60.0 in | Wt 139.0 lb

## 2022-08-14 DIAGNOSIS — Z01419 Encounter for gynecological examination (general) (routine) without abnormal findings: Secondary | ICD-10-CM | POA: Diagnosis not present

## 2022-08-14 NOTE — Progress Notes (Signed)
   Aimee Maldonado 1965/04/30 [id number]   History:  57 y.o. G5P2 presents for annual exam. Periods  have become more irregular over the past year. Has occasional hot flashes, otherwise doing well. Sees PCP to manage her chronic conditions.   Gynecologic History Patient's last menstrual period was 08/12/2022 (approximate). Period Pattern: (!) Irregular Menstrual Flow: Light Menstrual Control: Thin pad, Panty liner, Tampon Dysmenorrhea: None Contraception/Family planning: none Sexually active: yes Last Pap: 2011. Results were: normal Last mammogram: 04/12/22. Results were: normal  Obstetric History OB History  Gravida Para Term Preterm AB Living  5 2     3 2   SAB IAB Ectopic Multiple Live Births               # Outcome Date GA Lbr Len/2nd Weight Sex Delivery Anes PTL Lv  5 AB           4 AB           3 AB           2 Para           1 Para              The following portions of the patient's history were reviewed and updated as appropriate: allergies, current medications, past family history, past medical history, past social history, past surgical history, and problem list.  Review of Systems Pertinent items noted in HPI and remainder of comprehensive ROS otherwise negative.   Past medical history, past surgical history, family history and social history were all reviewed and documented in the EPIC chart.   Exam:  Vitals:   08/14/22 1338  BP: 132/88  Weight: 139 lb (63 kg)  Height: 5' (1.524 m)   Body mass index is 27.15 kg/m.  General appearance:  Normal Thyroid:  Symmetrical, normal in size, without palpable masses or nodularity. Respiratory  Auscultation:  Clear without wheezing or rhonchi Cardiovascular  Auscultation:  Regular rate, without rubs, murmurs or gallops  Edema/varicosities:  Not grossly evident Abdominal  Soft,nontender, without masses, guarding or rebound.  Liver/spleen:  No organomegaly noted  Hernia:  None appreciated  Skin  Inspection:   Grossly normal Breasts: Examined lying and sitting.   Right: Without masses, retractions, nipple discharge or axillary adenopathy.   Left: Without masses, retractions, nipple discharge or axillary adenopathy. Genitourinary   Inguinal/mons:  Normal without inguinal adenopathy  External genitalia:  Normal appearing vulva with no masses, tenderness, or lesions  BUS/Urethra/Skene's glands:  Normal without masses or exudate  Vagina:  Normal appearing with normal color and discharge, no lesions  Cervix:  Normal appearing without discharge or lesions  Uterus:  Normal in size, shape and contour.  Mobile, nontender  Adnexa/parametria:     Rt: Normal in size, without masses or tenderness.   Lt: Normal in size, without masses or tenderness.  Anus and perineum: Normal   Raynelle Fanning, CMA present for exam  Assessment/Plan:   1. Well female exam with routine gynecological exam - Mammogram up to date - Cytology - PAP( [place])     Discussed SBE, colonoscopy and DEXA screening as directed/appropriate. Recommend of exercise weekly, including weight bearing exercise. Encouraged the use of seatbelts and sunscreen. Return in 1 year for annual or as needed.   [name] B WHNP-BC 2:00 PM 08/14/2022

## 2022-08-16 LAB — CYTOLOGY - PAP
Adequacy: ABSENT
Comment: NEGATIVE
Diagnosis: NEGATIVE
High risk HPV: NEGATIVE

## 2022-10-24 DIAGNOSIS — F331 Major depressive disorder, recurrent, moderate: Secondary | ICD-10-CM | POA: Insufficient documentation

## 2023-01-09 ENCOUNTER — Telehealth: Payer: Self-pay | Admitting: *Deleted

## 2023-01-09 NOTE — Telephone Encounter (Signed)
Spoke with patient. Patient reports irregular menses for the past year. Menses in oct was light to heavy. Menses started again in Nov, has been on now for 4 weeks. Flow varies, currently changing pad 1-2 times per day. Intermittent mild, menses like cramps. Denies heavy bleeding or pain. Reports increasing fatigue. Denies lightheadedness, weakness, SOB.   OV scheduled for 12/6 at 0930. Patient declined earlier OV.  ER precautions provided for new or worsening symptoms. Patient verbalizes understanding and is agreeable.   Last AEX 08/14/22  Routing to provider for final review. Patient is agreeable to disposition. Will close encounter.

## 2023-01-09 NOTE — Telephone Encounter (Signed)
Patient left message requesting return call to discuss irregular menses.

## 2023-01-10 ENCOUNTER — Ambulatory Visit: Payer: 59 | Admitting: Family Medicine

## 2023-01-10 VITALS — BP 132/70 | HR 90 | Temp 98.0°F | Ht 60.0 in | Wt 142.4 lb

## 2023-01-10 DIAGNOSIS — N939 Abnormal uterine and vaginal bleeding, unspecified: Secondary | ICD-10-CM | POA: Diagnosis not present

## 2023-01-10 DIAGNOSIS — R5383 Other fatigue: Secondary | ICD-10-CM | POA: Diagnosis not present

## 2023-01-10 DIAGNOSIS — R9089 Other abnormal findings on diagnostic imaging of central nervous system: Secondary | ICD-10-CM | POA: Diagnosis not present

## 2023-01-10 DIAGNOSIS — Z711 Person with feared health complaint in whom no diagnosis is made: Secondary | ICD-10-CM | POA: Diagnosis not present

## 2023-01-10 DIAGNOSIS — I10 Essential (primary) hypertension: Secondary | ICD-10-CM

## 2023-01-10 DIAGNOSIS — F329 Major depressive disorder, single episode, unspecified: Secondary | ICD-10-CM

## 2023-01-10 DIAGNOSIS — R251 Tremor, unspecified: Secondary | ICD-10-CM | POA: Diagnosis not present

## 2023-01-10 LAB — COMPREHENSIVE METABOLIC PANEL
ALT: 16 U/L (ref 0–35)
AST: 15 U/L (ref 0–37)
Albumin: 4.1 g/dL (ref 3.5–5.2)
Alkaline Phosphatase: 81 U/L (ref 39–117)
BUN: 12 mg/dL (ref 6–23)
CO2: 31 meq/L (ref 19–32)
Calcium: 8.9 mg/dL (ref 8.4–10.5)
Chloride: 100 meq/L (ref 96–112)
Creatinine, Ser: 0.65 mg/dL (ref 0.40–1.20)
GFR: 99.31 mL/min (ref >60.00)
Glucose, Bld: 113 mg/dL — ABNORMAL HIGH (ref 70–99)
Potassium: 3.4 meq/L — ABNORMAL LOW (ref 3.5–5.1)
Sodium: 138 meq/L (ref 135–145)
Total Bilirubin: 0.2 mg/dL (ref 0.2–1.2)
Total Protein: 6.8 g/dL (ref 6.0–8.3)

## 2023-01-10 LAB — CBC
HCT: 45.8 % (ref 36.0–46.0)
Hemoglobin: 15.9 g/dL — ABNORMAL HIGH (ref 12.0–15.0)
MCHC: 34.7 g/dL (ref 30.0–36.0)
MCV: 89.9 fL (ref 78.0–100.0)
Platelets: 267 10*3/uL (ref 150.0–400.0)
RBC: 5.09 Mil/uL (ref 3.87–5.11)
RDW: 14 % (ref 11.5–15.5)
WBC: 9.4 10*3/uL (ref 4.0–10.5)

## 2023-01-10 LAB — TSH: TSH: 1.15 u[IU]/mL (ref 0.35–5.50)

## 2023-01-10 NOTE — Progress Notes (Signed)
Subjective:  Patient ID: Aimee Maldonado, female    DOB: 1965-08-12  Age: 57 y.o. MRN: [id number]  CC:  Chief Complaint  Patient presents with   Medical Management of Chronic Issues    Pt is doing okay,    Vaginal Bleeding    Pt notes vaginal bleeding for apx 3 weeks cycle was pretty normal previous to this episode. Notes no abnormality other than the duration of the bleeding     HPI Aimee Maldonado presents for   Hypertension: With history of SVT followed by cardiology, Dr. Gaynelle Arabian.  Hydrochlorothiazide 25 mg daily, Toprol XL 25 mg daily.  Some fatigue discussed in May.  Lower dose Toprol as option. CBC at her May visit with elevated hemoglobin, normalized on repeat testing in June. Home readings: none.  No CP/dyspnea.  BP Readings from Last 3 Encounters:  01/10/23 132/70  08/14/22 132/88  07/11/22 122/66   Lab Results  Component Value Date   CREATININE 0.83 06/06/2022   Abnormal vaginal bleeding/uterine bleeding. Past 4-5 weeks with some persistent vaginal bleeding, not heavy bleeding but consistent, varaible. Heavier menses last month. Lighter menses previously had normal menses. Sexually active, no contraception. Has appt with GYN tomorrow.  [facility] [place].  Last exam in July. Has experienced some other menopausal sx's past 6 months - hot flushes, mood changes. Defers workup to GYN tomorrow.     Tremor, memory concern, abnormal brain MRI: She has been seen by neurology for facial spasms in September, no potential pathology on MRI.  Neurological exam noted to be at baseline.  Recommended follow-up with psychiatry, skull base MRI discussed but declined at that time. Initially had been referred to neuro after MRI performed by ENT with some concern on MRI.Frustrating visit as main reason was for MRI review.  MRI report noted from 9/24 through [facility].  No acute intracranial process.  Multiple bilateral white matter lesions identified abnormal but  nonspecific.  Perpendicular orientation to the lateral ventricles, clinical correlation suggested. Would like 2nd opinion. Has had some concerns with putting words together, memory concerns for past year or two. Tremors in hands at times - R more than left hand at times. Past few years. Has not d/w psychiatrist.   Depression: Cymbalta 120 mg daily, Trazodone at bedtime, alprazolam if needed and on Auvelity.  Followed by psychiatry, had been started on Auvelity prior to her May visit as a possible contributor to fatigue.   Planned psychiatry visit in November - had to reschedule, with appt in next 2 weeks.  Feeling more down/depressed.  Feeling tired.  Suicidal thoughts - denies plan or intent, but fleeting thought of "it wouldn't be the worst thing", but no plan.  Difficulty year. Dog put to sleep a week ago.  Taking care of mother has been stressful.  Found out last year that husband was cheating. Prior therapy - not now. No current therapist.  [place]      01/10/2023    1:13 PM 07/11/2022    1:14 PM 06/06/2022    1:09 PM 11/11/2020   10:19 AM 08/11/2020    1:41 PM  Depression screen PHQ 2/9  Decreased Interest 2 1 2 1 1   Down, Depressed, Hopeless 2 1 2 1 1   PHQ - 2 Score 4 2 4 2 2   Altered sleeping 3 1 2 3 2   Tired, decreased energy 3 1 3 3 2   Change in appetite 2 0 1  1 3  Feeling bad or failure about yourself  3 2 1 1 2   Trouble concentrating 0 0 1 0 0  Moving slowly or fidgety/restless 0 0 0 0 0  Suicidal thoughts 1 1 2  0 0  PHQ-9 Score 16 7 14 10 11   Difficult doing work/chores Very difficult  Very difficult  Not difficult at all     Declines flu vaccine.   History Patient Active Problem List   Diagnosis Date Noted   SVT (supraventricular tachycardia) (HCC) 08/11/2020   Past Medical History:  Diagnosis Date   Anxiety    Arthritis    Atrial fibrillation    Blood transfusion without reported diagnosis    Depression    Hypertension     Past Surgical History:  Procedure Laterality Date   CESAREAN SECTION  05/2009   LIPOSUCTION  03/2021   [place]   mscarrigage     No Known Allergies Prior to Admission medications   Medication Sig Start Date End Date Taking? Authorizing Provider  ALPRAZolam Prudy Feeler) 0.25 MG tablet Take 1 tablet by mouth daily as needed.    [provider]  AUVELITY 45-105 MG TBCR Take 1 tablet by mouth daily. 05/17/22   [provider]  DULoxetine (CYMBALTA) 60 MG capsule Take by mouth daily. 12/29/19   [provider]  hydrochlorothiazide (HYDRODIURIL) 25 MG tablet Take 1 tablet (25 mg total) by mouth daily. 06/06/22   Shade Flood, MD  metoprolol succinate (TOPROL-XL) 50 MG 24 hr tablet Take 1 tablet (50 mg total) by mouth daily. Take with or immediately following a meal. 06/06/22   Shade Flood, MD  traZODone (DESYREL) 50 MG tablet Take 1-2 tablets by mouth at bedtime.    [provider]   Social History   Socioeconomic History   Marital status: Married    Spouse name: Not on file   Number of children: Not on file   Years of education: Not on file   Highest education level: Not on file  Occupational History   Not on file  Tobacco Use   Smoking status: Never    Passive exposure: Never   Smokeless tobacco: Never  Vaping Use   Vaping status: Never Used  Substance and Sexual Activity   Alcohol use: No   Drug use: No   Sexual activity: Yes    Partners: Male    Birth control/protection: None    Comment: menarche 57yo, sexual debut 57yo  Other Topics Concern   Not on file  Social History Narrative   Not on file   Social Determinants of Health   Financial Resource Strain: Patient Declined (10/24/2022)   Received from [facility]   Overall Financial Resource Strain (CARDIA)    Difficulty of Paying Living Expenses: Patient declined  Food Insecurity: Patient Declined (10/24/2022)   Received from [facility]   Hunger Vital Sign    Worried  About Running Out of Food in the Last Year: Patient declined    Ran Out of Food in the Last Year: Patient declined  Transportation Needs: Patient Declined (10/24/2022)   Received from [facility] - Transportation    Lack of Transportation (Medical): Patient declined    Lack of Transportation (Non-Medical): Patient declined  Physical Activity: Not on file  Stress: Not on file  Social Connections: Unknown (09/28/2022)   Received from [facility]   Social Network    Social Network: Not on file  Intimate Partner Violence: Unknown (09/28/2022)  Received from [facility]   HITS    Physically Hurt: Not on file    Insult or Talk Down To: Not on file    Threaten Physical Harm: Not on file    Scream or Curse: Not on file    Review of Systems Per HPI.   Objective:   Vitals:   01/10/23 1309  BP: 132/70  Pulse: 90  Temp: 98 F (36.7 C)  TempSrc: Temporal  SpO2: 97%  Weight: 142 lb 6.4 oz (64.6 kg)  Height: 5' (1.524 m)     Physical Exam Vitals reviewed.  Constitutional:      Appearance: Normal appearance. She is well-developed.  HENT:     Head: Normocephalic and atraumatic.  Eyes:     Conjunctiva/sclera: Conjunctivae normal.     Pupils: Pupils are equal, round, and reactive to light.  Neck:     Vascular: No carotid bruit.  Cardiovascular:     Rate and Rhythm: Normal rate and regular rhythm.     Heart sounds: Normal heart sounds.  Pulmonary:     Effort: Pulmonary effort is normal.     Breath sounds: Normal breath sounds.  Abdominal:     Palpations: Abdomen is soft. There is no pulsatile mass.     Tenderness: There is no abdominal tenderness.  Musculoskeletal:     Right lower leg: No edema.     Left lower leg: No edema.  Skin:    General: Skin is warm and dry.  Neurological:     General: No focal deficit present.     Mental Status: She is alert and oriented to person, place, and time.     GCS: GCS eye subscore is 4. GCS verbal subscore is 5. GCS  motor subscore is 6.     Cranial Nerves: No cranial nerve deficit or facial asymmetry.     Motor: Tremor (Possible faint tremor in fingers with holding hands outstretched.  Normal finger-to-nose.  No change in tremor with intention.) present. No weakness or pronator drift.     Coordination: Coordination is intact. Finger-Nose-Finger Test and Heel to [facility] Test normal.  Psychiatric:        Mood and Affect: Mood normal.        Behavior: Behavior normal.       01/10/2023    2:00 PM 01/10/2023    1:59 PM 01/10/2023    1:57 PM  6CIT Screen  What Year? 0 points 0 points 0 points  What month? 0 points  0 points  What time? 0 points  0 points  Count back from 20 0 points  0 points  Months in reverse 0 points  0 points  Repeat phrase 2 points  2 points  Total Score 2 points  2 points     46 minutes spent during visit, including chart review, discussion of various concerns above including recent concerns of abnormal uterine bleeding, fatigue, counseling and assimilation of information, exam, discussion of plan, and chart completion.   Assessment & Plan:  Kaleyah Lykes is a 57 y.o. female . Concern about memory - Plan: Ambulatory referral to Neurology Abnormal brain MRI - Plan: Ambulatory referral to Neurology Tremor of both hands - Plan: Ambulatory referral to Neurology  -Request second opinion to discuss nonspecific abnormalities on MRI.  Also concerned about memory, intermittent tremor.  Neurology referral placed.  6 CIT results as above.  Further testing be determined by neurology.  Abnormal uterine bleeding - Plan: Ambulatory referral to Neurology, CBC Fatigue, unspecified  type - Plan: Comprehensive metabolic panel, TSH  -Check CBC, TSH with fatigue, unlikely anemia cause but with recent abnormal uterine bleeding check updated labs, follow-up with gynecology as planned regarding AUB, RTC/ER precautions with fatigue.  Could be related to her meds as well.  Recheck in 6 weeks to follow-up on  fatigue after evaluation with specialists as above hopefully by that point.  Major depressive disorder with current active episode, unspecified depression episode severity, unspecified whether recurrent  -Under care of psychiatry.  Fleeting thoughts as above without true suicidal ideation, intent or plan.  988 hotline discussed, ER precautions discussed and keep close follow-up with psychiatry.  Primary hypertension In office reading stable on current regimen, no changes for now.  Labs as above.  No orders of the defined types were placed in this encounter.  Patient Instructions  If any thoughts of suicide call [phone] or be seen by medical provider immediately.  I do recommend discussing therapy with your psychiatrist at upcoming visit.  Hang in there.  I will check labs today, including blood count for fatigue and will discuss further at follow up.   I placed a referral to neurology.   Keep follow-up with gynecology tomorrow to discuss abnormal bleeding, but that could be perimenopausal bleeding, especially with previous heavier menses and then lighter menses prior to that time.  I will check blood count today.  Follow-up in 6 weeks and we can discuss fatigue further at that time as hopefully you will have seen psychiatry, possibly neurology and gynecology and may have some other information about other possible causes of that fatigue.  If any new or worsening symptoms, please be seen right away.  Take care.           Signed,   Meredith Staggers, MD [facility], [facility] 01/10/23 1:54 PM

## 2023-01-10 NOTE — Patient Instructions (Addendum)
If any thoughts of suicide call [phone] or be seen by medical provider immediately.  I do recommend discussing therapy with your psychiatrist at upcoming visit.  Hang in there.  I will check labs today, including blood count for fatigue and will discuss further at follow up.   I placed a referral to neurology.   Keep follow-up with gynecology tomorrow to discuss abnormal bleeding, but that could be perimenopausal bleeding, especially with previous heavier menses and then lighter menses prior to that time.  I will check blood count today.  Follow-up in 6 weeks and we can discuss fatigue further at that time as hopefully you will have seen psychiatry, possibly neurology and gynecology and may have some other information about other possible causes of that fatigue.  If any new or worsening symptoms, please be seen right away.  Take care.    Return to the clinic or go to the nearest emergency room if any of your symptoms worsen or new symptoms occur.  Fatigue If you have fatigue, you feel tired all the time and have a lack of energy or a lack of motivation. Fatigue may make it difficult to start or complete tasks because of exhaustion. Occasional or mild fatigue is often a normal response to activity or life. However, long-term (chronic) or extreme fatigue may be a symptom of a medical condition such as: Depression. Not having enough red blood cells or hemoglobin in the blood (anemia). A problem with a small gland located in the lower front part of the neck (thyroid disorder). Rheumatologic conditions. These are problems related to the body's defense system (immune system). Infections, especially certain viral infections. Fatigue can also lead to negative health outcomes over time. Follow these instructions at home: Medicines Take over-the-counter and prescription medicines only as told by your health care provider. Take a multivitamin if told by your health care provider. Do not use herbal or  dietary supplements unless they are approved by your health care provider. Eating and drinking  Avoid heavy meals in the evening. Eat a well-balanced diet, which includes lean proteins, whole grains, plenty of fruits and vegetables, and low-fat dairy products. Avoid eating or drinking too many products with caffeine in them. Avoid alcohol. Drink enough fluid to keep your urine pale yellow. Activity  Exercise regularly, as told by your health care provider. Use or practice techniques to help you relax, such as yoga, tai chi, meditation, or massage therapy. Lifestyle Change situations that cause you stress. Try to keep your work and personal schedules in balance. Do not use recreational or illegal drugs. General instructions Monitor your fatigue for any changes. Go to bed and get up at the same time every day. Avoid fatigue by pacing yourself during the day and getting enough sleep at night. Maintain a healthy weight. Contact a health care provider if: Your fatigue does not get better. You have a fever. You suddenly lose or gain weight. You have headaches. You have trouble falling asleep or sleeping through the night. You feel angry, guilty, anxious, or sad. You have swelling in your legs or another part of your body. Get help right away if: You feel confused, feel like you might faint, or faint. Your vision is blurry or you have a severe headache. You have severe pain in your abdomen, your back, or the area between your waist and hips (pelvis). You have chest pain, shortness of breath, or an irregular or fast heartbeat. You are unable to urinate, or you urinate less than  normal. You have abnormal bleeding from the rectum, nose, lungs, nipples, or, if you are female, the vagina. You vomit blood. You have thoughts about hurting yourself or others. These symptoms may be an emergency. Get help right away. Call 911. Do not wait to see if the symptoms will go away. Do not drive  yourself to the hospital. Get help right away if you feel like you may hurt yourself or others, or have thoughts about taking your own life. Go to your nearest emergency room or: Call 911. Call the National Suicide Prevention Lifeline at 2280533782 or 988. This is open 24 hours a day. Text the Crisis Text Line at 7731952813. Summary If you have fatigue, you feel tired all the time and have a lack of energy or a lack of motivation. Fatigue may make it difficult to start or complete tasks because of exhaustion. Long-term (chronic) or extreme fatigue may be a symptom of a medical condition. Exercise regularly, as told by your health care provider. Change situations that cause you stress. Try to keep your work and personal schedules in balance. This information is not intended to replace advice given to you by your health care provider. Make sure you discuss any questions you have with your health care provider. Document Revised: 11/14/2020 Document Reviewed: 11/14/2020 Elsevier Patient Education  2024 [employer].

## 2023-01-11 ENCOUNTER — Encounter: Payer: Self-pay | Admitting: Obstetrics and Gynecology

## 2023-01-11 ENCOUNTER — Ambulatory Visit: Payer: 59 | Admitting: Obstetrics and Gynecology

## 2023-01-11 ENCOUNTER — Ambulatory Visit: Payer: 59 | Admitting: Radiology

## 2023-01-11 VITALS — BP 128/72 | HR 81 | Wt 141.0 lb

## 2023-01-11 DIAGNOSIS — I10 Essential (primary) hypertension: Secondary | ICD-10-CM | POA: Insufficient documentation

## 2023-01-11 DIAGNOSIS — N939 Abnormal uterine and vaginal bleeding, unspecified: Secondary | ICD-10-CM | POA: Insufficient documentation

## 2023-01-11 DIAGNOSIS — F411 Generalized anxiety disorder: Secondary | ICD-10-CM | POA: Diagnosis not present

## 2023-01-11 DIAGNOSIS — F329 Major depressive disorder, single episode, unspecified: Secondary | ICD-10-CM | POA: Diagnosis not present

## 2023-01-11 MED ORDER — NORETHINDRONE ACETATE 5 MG PO TABS: ORAL_TABLET | ORAL | 0 refills | Status: DC

## 2023-01-11 NOTE — Progress Notes (Signed)
57 y.o. [id number] female here for AUB.  Patient's last menstrual period was 12/07/2022 (approximate).   Pt states that her menstrual cycle has been continuous for the past month, regular flow, has some blood clots, denies cramping, states achy at times. States her last menstrual in Oct she experience a heavy flow all at once with a lot of clotting.      Component Value Date/Time   DIAGPAP  08/14/2022 1357    - Negative for intraepithelial lesion or malignancy (NILM)   HPVHIGH Negative 08/14/2022 1357   ADEQPAP  08/14/2022 1357    Satisfactory for evaluation; transformation zone component ABSENT.      Latest Ref Rng & Units 01/10/2023    2:04 PM 07/11/2022    1:58 PM 06/06/2022    1:58 PM  CBC  WBC 4.0 - 10.5 K/uL 9.4  10.1  8.3   Hemoglobin 12.0 - 15.0 g/dL 44.0  10.2  72.5   Hematocrit 36.0 - 46.0 % 45.8  45.2  46.3   Platelets 150.0 - 400.0 K/uL 267.0  293.0  338.0    TSH wnl  GYN HISTORY: None  OB History  Gravida Para Term Preterm AB Living  5 2     3 2   SAB IAB Ectopic Multiple Live Births               # Outcome Date GA Lbr Len/2nd Weight Sex Type Anes PTL Lv  5 AB           4 AB           3 AB           2 Para           1 Para             Past Medical History:  Diagnosis Date   Anxiety    Arthritis    Atrial fibrillation    Blood transfusion without reported diagnosis    Depression    Hypertension     Past Surgical History:  Procedure Laterality Date   CESAREAN SECTION  05/2009   LIPOSUCTION  03/2021   [place]   mscarrigage      Current Outpatient Medications on File Prior to Visit  Medication Sig Dispense Refill   ALPRAZolam (XANAX) 0.25 MG tablet Take 1 tablet by mouth daily as needed.     AUVELITY 45-105 MG TBCR Take 1 tablet by mouth daily.     DULoxetine (CYMBALTA) 60 MG capsule Take by mouth daily.     hydrochlorothiazide (HYDRODIURIL) 25 MG tablet Take 1 tablet (25 mg total) by mouth daily. 90 tablet 1   metoprolol succinate  (TOPROL-XL) 50 MG 24 hr tablet Take 1 tablet (50 mg total) by mouth daily. Take with or immediately following a meal. 90 tablet 1   traZODone (DESYREL) 50 MG tablet Take 1-2 tablets by mouth at bedtime.     No current facility-administered medications on file prior to visit.    No Known Allergies    PE Today's Vitals   01/11/23 1031  BP: 128/72  Pulse: 81  SpO2: 98%  Weight: 141 lb (64 kg)   Body mass index is 27.54 kg/m.  Physical Exam Vitals reviewed. Exam conducted with a chaperone present.  Constitutional:      General: She is not in acute distress.    Appearance: Normal appearance.  HENT:     Head: Normocephalic and atraumatic.     Nose: Nose normal.  Eyes:  Extraocular Movements: Extraocular movements intact.     Conjunctiva/sclera: Conjunctivae normal.  Pulmonary:     Effort: Pulmonary effort is normal.  Genitourinary:    General: Normal vulva.     Exam position: Lithotomy position.     Vagina: Normal. No vaginal discharge.     Cervix: Normal. No cervical motion tenderness, discharge or lesion.     Uterus: Normal. Not enlarged and not tender.      Adnexa: Right adnexa normal and left adnexa normal.     Comments: +menses Musculoskeletal:        General: Normal range of motion.     Cervical back: Normal range of motion.  Neurological:     General: No focal deficit present.     Mental Status: She is alert.  Psychiatric:        Mood and Affect: Mood normal.        Behavior: Behavior normal.       Assessment and Plan:        Abnormal uterine bleeding (AUB) Assessment & Plan: Will complete hormonal work-up, [place] and EMB if >45yo or risk factors for endometrial hyperplasia or malignancy. First line therapies reviewed, including hormonal therapy and tranexamic acid.  Will start aygestin taper RTO for [place] + EMB within 4wk. Will likely need external [place] due to time frame. Will discuss further management at that time.   Orders: -     FSH/LH -      Prolactin -     US PELVIS TRANSVAGINAL NON-OB (TV ONLY); Future -     Endometrial biopsy; Future -     hCG, quantitative, pregnancy -     Norethindrone Acetate; Take 1 tablet (5 mg total) by mouth 2 (two) times daily for 3 days, THEN 1 tablet (5 mg total) daily for 11 days.  Dispense: 17 tablet; Refill: 0     Rosalyn Gess, MD

## 2023-01-11 NOTE — Patient Instructions (Signed)
Take ibuprofen 30 mins prior to endometrial biopsy.

## 2023-01-11 NOTE — Assessment & Plan Note (Signed)
Will complete hormonal work-up, [place] and EMB if >55yo or risk factors for endometrial hyperplasia or malignancy. First line therapies reviewed, including hormonal therapy and tranexamic acid.  Will start aygestin taper RTO for [place] + EMB within 4wk. Will likely need external [place] due to time frame. Will discuss further management at that time.

## 2023-01-12 LAB — FSH/LH
FSH: 8.8 m[IU]/mL
LH: 2.7 m[IU]/mL

## 2023-01-12 LAB — PROLACTIN: Prolactin: 11.1 ng/mL

## 2023-01-12 LAB — HCG, QUANTITATIVE, PREGNANCY: HCG, Total, QN: <5 m[IU]/mL

## 2023-01-13 ENCOUNTER — Encounter: Payer: Self-pay | Admitting: Family Medicine

## 2023-01-14 ENCOUNTER — Telehealth: Payer: Self-pay | Admitting: *Deleted

## 2023-01-14 NOTE — Telephone Encounter (Signed)
-----   Message from Rosalyn Gess sent at 01/11/2023 11:02 AM EST ----- Recommend external TVUS to be performed prior to EMB in 4 weeks.

## 2023-01-14 NOTE — Telephone Encounter (Signed)
Patient is currently scheduled for PUS and EMB on 02/14/23.    Left message to call [facility] Triage at [phone], option 4.

## 2023-01-18 NOTE — Progress Notes (Signed)
Pt has reviewed via MyChart

## 2023-01-22 NOTE — Telephone Encounter (Signed)
LVMTCB

## 2023-01-28 ENCOUNTER — Telehealth: Payer: Self-pay

## 2023-01-28 NOTE — Telephone Encounter (Signed)
Patient called & left message on triage voicemail. She states she just finished taking her aygestin 5mg  today & is still bleeding. Her bleeding is moderate (regular) flow. Patient states she was told to call if it did not stop her bleeding by the time she finished it. Routing to covering Dr Karma Greaser) for review. Patient is aware that Dr Kennith Center is out of the office today & that the office is closed tomorrow & Christmas. Patient is aware there is a Dr on call if needed. Please message patient back after review.

## 2023-01-30 ENCOUNTER — Encounter: Payer: Self-pay | Admitting: Obstetrics and Gynecology

## 2023-01-31 ENCOUNTER — Other Ambulatory Visit: Payer: Self-pay | Admitting: Obstetrics and Gynecology

## 2023-01-31 DIAGNOSIS — N939 Abnormal uterine and vaginal bleeding, unspecified: Secondary | ICD-10-CM

## 2023-01-31 MED ORDER — NORETHINDRONE ACETATE 5 MG PO TABS: 10.0000 mg | ORAL_TABLET | Freq: Every day | ORAL | 0 refills | Status: DC

## 2023-01-31 NOTE — Telephone Encounter (Signed)
Pt has not viewed Dr. Bonney Roussel recommendations. LVMTCB per DPR form to discuss if bleeding has changed

## 2023-01-31 NOTE — Telephone Encounter (Signed)
Per GH:  "In agreement. I will send new rx."   Pt notified via DVM per DPR and to call us back if bleeding doesn't slow or stop.   Routing to provider for final review. Encounter closed.

## 2023-01-31 NOTE — Telephone Encounter (Signed)
Spoke w/ the pt and confirmed that she has no more aygestin on hand to follow through with Eb's recommendations. Can you please send in refill if you are agreeable to Eb's recommendations or please provide additional if needed.  Pt states that her bleeding has gotten heavier since last phone call on 12/23. States she is soaking through or having to change pad/tampon q couple of hours. Thanks.

## 2023-01-31 NOTE — Telephone Encounter (Signed)
Refer to encounter opened 12/23. Will close this encounter.

## 2023-02-02 ENCOUNTER — Other Ambulatory Visit: Payer: Self-pay

## 2023-02-02 ENCOUNTER — Telehealth: Payer: Self-pay | Admitting: Obstetrics

## 2023-02-02 ENCOUNTER — Encounter (HOSPITAL_BASED_OUTPATIENT_CLINIC_OR_DEPARTMENT_OTHER): Payer: Self-pay | Admitting: Emergency Medicine

## 2023-02-02 ENCOUNTER — Emergency Department (HOSPITAL_BASED_OUTPATIENT_CLINIC_OR_DEPARTMENT_OTHER)
Admission: EM | Admit: 2023-02-02 | Discharge: 2023-02-02 | Disposition: A | Discharge status: Home / Self Care | Payer: 59

## 2023-02-02 ENCOUNTER — Emergency Department (HOSPITAL_BASED_OUTPATIENT_CLINIC_OR_DEPARTMENT_OTHER): Payer: 59

## 2023-02-02 DIAGNOSIS — N939 Abnormal uterine and vaginal bleeding, unspecified: Secondary | ICD-10-CM | POA: Diagnosis present

## 2023-02-02 DIAGNOSIS — I10 Essential (primary) hypertension: Secondary | ICD-10-CM | POA: Insufficient documentation

## 2023-02-02 DIAGNOSIS — Z79899 Other long term (current) drug therapy: Secondary | ICD-10-CM | POA: Insufficient documentation

## 2023-02-02 LAB — CBC
HCT: 43.9 % (ref 36.0–46.0)
Hemoglobin: 14.7 g/dL (ref 12.0–15.0)
MCH: 29.5 pg (ref 26.0–34.0)
MCHC: 33.5 g/dL (ref 30.0–36.0)
MCV: 88.2 fL (ref 80.0–100.0)
Platelets: 379 10*3/uL (ref 150–400)
RBC: 4.98 MIL/uL (ref 3.87–5.11)
RDW: 13.7 % (ref 11.5–15.5)
WBC: 11.6 10*3/uL — ABNORMAL HIGH (ref 4.0–10.5)
nRBC: 0 % (ref 0.0–0.2)

## 2023-02-02 MED ORDER — NAPROXEN 500 MG PO TABS: 500.0000 mg | ORAL_TABLET | Freq: Two times a day (BID) | ORAL | 0 refills | Status: DC

## 2023-02-02 MED ORDER — MEDROXYPROGESTERONE ACETATE 5 MG PO TABS: 20.0000 mg | ORAL_TABLET | Freq: Three times a day (TID) | ORAL | 0 refills | Status: DC

## 2023-02-02 MED ORDER — ONDANSETRON HCL 4 MG PO TABS: 4.0000 mg | ORAL_TABLET | Freq: Four times a day (QID) | ORAL | 0 refills | Status: DC

## 2023-02-02 NOTE — ED Provider Notes (Signed)
[place] EMERGENCY DEPARTMENT AT [facility] Provider Note   CSN: [id number] Arrival date & time: 02/02/23  1730     History  No chief complaint on file.   Aimee Maldonado is a 57 y.o. female.  57 year old female with past medical history of atrial fibrillation hypertension who is not on any blood thinner medications presenting to the emergency department today with concern for vaginal bleeding.  The patient has been having intermittent vaginal bleeding for the past few months.  She has been on norethindrone from her OB/GYN and this did seem to help initially.  She reports that over the past few days that her bleeding seems to be getting worse.  She has been using 10 pads per day.  She was having a lot of cramping which has since improved.  The plan is to have an endometrial biopsy on the ninth of this month.  She was lightheaded earlier but states this has improved as well.  Scanned to the ER for further evaluation regarding this.        Home Medications Prior to Admission medications   Medication Sig Start Date End Date Taking? Authorizing Provider  medroxyPROGESTERone (PROVERA) 5 MG tablet Take 4 tablets (20 mg total) by mouth in the morning, at noon, and at bedtime. 02/02/23  Yes Durwin Glaze, MD  naproxen (NAPROSYN) 500 MG tablet Take 1 tablet (500 mg total) by mouth 2 (two) times daily. 02/02/23  Yes Durwin Glaze, MD  ondansetron (ZOFRAN) 4 MG tablet Take 1 tablet (4 mg total) by mouth every 6 (six) hours. 02/02/23  Yes Durwin Glaze, MD  ALPRAZolam Prudy Feeler) 0.25 MG tablet Take 1 tablet by mouth daily as needed.    [provider]  AUVELITY 45-105 MG TBCR Take 1 tablet by mouth daily. 05/17/22   [provider]  DULoxetine (CYMBALTA) 60 MG capsule Take by mouth daily. 12/29/19   [provider]  hydrochlorothiazide (HYDRODIURIL) 25 MG tablet Take 1 tablet (25 mg total) by mouth daily. 06/06/22   Shade Flood, MD  metoprolol succinate  (TOPROL-XL) 50 MG 24 hr tablet Take 1 tablet (50 mg total) by mouth daily. Take with or immediately following a meal. 06/06/22   Shade Flood, MD  traZODone (DESYREL) 50 MG tablet Take 1-2 tablets by mouth at bedtime.    [provider]      Allergies    Patient has no known allergies.    Review of Systems   Review of Systems  Genitourinary:  Positive for vaginal bleeding.  All other systems reviewed and are negative.   Physical Exam Updated Vital Signs BP (!) 141/112   Pulse (!) 102   Temp 97.9 F (36.6 C) (Oral)   Resp 20   Wt 63.5 kg   LMP 11/17/2022 (Approximate)   SpO2 100%   BMI 27.34 kg/m  Physical Exam Vitals and nursing note reviewed.   Gen: NAD Eyes: PERRL, EOMI HEENT: no oropharyngeal swelling Neck: trachea midline Resp: clear to auscultation bilaterally Card: RRR, no murmurs, rubs, or gallops Abd: nontender, nondistended Extremities: no calf tenderness, no edema Vascular: 2+ radial pulses bilaterally, 2+ DP pulses bilaterally Skin: no rashes Psyc: acting appropriately   ED Results / Procedures / Treatments   Labs (all labs ordered are listed, but only abnormal results are displayed) Labs Reviewed  CBC - Abnormal; Notable for the following components:      Result Value   WBC 11.6 (*)    All other components  within normal limits  PREGNANCY, URINE    EKG None  Radiology US PELVIC COMPLETE WITH TRANSVAGINAL Result Date: 02/02/2023 CLINICAL DATA:  Two-month history of heavy vaginal bleeding EXAM: TRANSABDOMINAL AND TRANSVAGINAL ULTRASOUND OF PELVIS TECHNIQUE: Both transabdominal and transvaginal ultrasound examinations of the pelvis were performed. Transabdominal technique was performed for global imaging of the pelvis including uterus, ovaries, adnexal regions, and pelvic cul-de-sac. It was necessary to proceed with endovaginal exam following the transabdominal exam to visualize the pelvic structures. COMPARISON:  None Available. FINDINGS:  Uterus Measurements: 8.5 cm in sagittal dimension. No fibroids or other mass visualized. Retroflexed uterus. Nabothian cysts. Endometrium Thickness: 5 mm.  No focal abnormality visualized. Right ovary Not seen. Left ovary Not seen. Other findings:  No abnormal free fluid. Technically challenging examination due to bowel gas. IMPRESSION: 1. Technically challenging examination due to bowel gas. Within this context, normal sonographic appearance of the uterus and endometrium. 2. Nonvisualization of the ovaries. Electronically Signed   By: Agustin Cree M.D.   On: 02/02/2023 19:22    Procedures Procedures    Medications Ordered in ED Medications - No data to display  ED Course/ Medical Decision Making/ A&P                                 Medical Decision Making 57 year old female with past medical history of hypertension and atrial fibrillation who is not on anticoagulation presenting to the emergency department today with vaginal bleeding.  The patient's heart rate has improved to the 90s here.  Her blood pressures here are stable.  Will go forward with ultrasound and labs to evaluate for anemia.  I will discuss her case with her OB/GYN team after her workup is complete to determine ultimate disposition.  The patient's heart rate has improved here.  Her hemoglobin is stable.  She denies any history of liver dysfunction or VTE in the past.  Her pelvic exam chaperoned by female nursing staff shows minimal dark blood in the posterior cul-de-sac with no active bleeding.  I did call and discuss her case with Dr. Olena Leatherwood.  She recommends starting the patient on Megace.  The patient will be prescribed this and she will be discharged with return precautions.  Will also prescribe a short course of NSAIDs until she is able to follow-up with her OB/GYN.    Amount and/or Complexity of Data Reviewed Labs: ordered. Radiology: ordered.           Final Clinical Impression(s) / ED Diagnoses Final diagnoses:   Abnormal uterine bleeding    Rx / DC Orders ED Discharge Orders          Ordered    medroxyPROGESTERone (PROVERA) 5 MG tablet  3 times daily        02/02/23 2143    naproxen (NAPROSYN) 500 MG tablet  2 times daily        02/02/23 2143    ondansetron (ZOFRAN) 4 MG tablet  Every 6 hours        02/02/23 2144              Durwin Glaze, MD 02/02/23 2144

## 2023-02-02 NOTE — ED Triage Notes (Signed)
Heavy menstrual bleeding since November,dizzy, nausea. Saw OB started progesterone first of December slowed it down but didn't stop, increased progesterone about 4 days ago, still heavy bleeding.

## 2023-02-02 NOTE — Telephone Encounter (Signed)
57yo with AUB since LMP 12/07/22 01/11/23 Started on norethindrone 5mg  taper (2 tab x 3 days followed by 1 tab/day for 11 days) with some decrease in bleeding. Planned for TVUS and EMB scheduled 02/14/23 with Dr. Kennith Center. Hgb 15.9 on 01/10/23.  Bleeding increased since 01/28/23 and Rx increased to norethindorone 10mg  daily for 0 days.  Bleeding worsened for the past 3 days since increase to Aygestin 5mg  BID on 12/26. Soaking 10 pads/day with large golf ball size clots x 3 days  Lightheaded and dizziness with nausea and cramping started today.   Patient reports being closest to ED at [facility], however due to lack of TVUS capabilities after 8pm patient was redirected to [facility].  Called and spoke with Fayrene Helper, PA in ED for bedside evaluation to assess VS, CBC, and need for transfusion. Recommended TVUS.  Discussed with patient risk of VTE with treatments needed to decreased vaginal bleeding.  Patient reports history of A.fib during pregnancies only, history of blood transfusion per chart review.  Consider increase PO norethindrone to 15mg /day and tranexamic acid if clinically stable.  Patient en route to [facility] and expressed understanding.

## 2023-02-02 NOTE — ED Notes (Signed)
In with Dr.Tee to chaperone a pelvic exam.

## 2023-02-02 NOTE — Discharge Instructions (Signed)
Please stop your medication that your OB/GYN started you on and start the medroxyprogesterone as prescribed.  Please take 20 mg 3 times daily for the next 7 days.  Take Zofran as needed for nausea.  Take the naproxen.  Please call Monday to schedule a follow-up appointment with your OB/GYN.  Return to the ER for worsening symptoms.

## 2023-02-04 NOTE — Telephone Encounter (Signed)
Spoke with patient. Patient reports no change in symptoms or bleeding. Changing saturated pad q2hrs with clots.   Reviewed medication changes, patient confirmed she switched to medroxyprogesterone 5 mg tab; however, only taking 1 tab tid. Patient states she did not see instructions to take 4 tabs (20 mg) tid. Patient will increase to 20 mg tid and call office with update on bleeding by end of day. States she is traveling to take her daughter back to college on 1/2, requesting to schedule EMB prior to leaving. EMB scheduled for 12/31 at 1600 with Dr. Kennith Center. Future appt canceled. Advised I will provide update to Dr. Kennith Center and return call with any additional recommendations, patient agreeable.   Routing to Dr. Kennith Center for review.

## 2023-02-04 NOTE — Addendum Note (Signed)
Addended by: Leda Min on: 02/04/2023 10:31 AM   Modules accepted: Orders

## 2023-02-05 ENCOUNTER — Encounter (HOSPITAL_COMMUNITY): Payer: Self-pay

## 2023-02-05 ENCOUNTER — Emergency Department (HOSPITAL_COMMUNITY)
Admission: EM | Admit: 2023-02-05 | Discharge: 2023-02-05 | Disposition: A | Discharge status: Home / Self Care | Payer: 59 | Attending: Emergency Medicine | Admitting: Emergency Medicine

## 2023-02-05 ENCOUNTER — Ambulatory Visit: Payer: 59 | Admitting: Obstetrics and Gynecology

## 2023-02-05 ENCOUNTER — Telehealth: Payer: Self-pay | Admitting: Obstetrics and Gynecology

## 2023-02-05 ENCOUNTER — Other Ambulatory Visit: Payer: Self-pay

## 2023-02-05 DIAGNOSIS — N939 Abnormal uterine and vaginal bleeding, unspecified: Secondary | ICD-10-CM | POA: Insufficient documentation

## 2023-02-05 DIAGNOSIS — Z79899 Other long term (current) drug therapy: Secondary | ICD-10-CM | POA: Diagnosis not present

## 2023-02-05 DIAGNOSIS — I10 Essential (primary) hypertension: Secondary | ICD-10-CM | POA: Diagnosis not present

## 2023-02-05 LAB — CBC WITH DIFFERENTIAL/PLATELET
Abs Immature Granulocytes: 0.05 10*3/uL (ref 0.00–0.07)
Basophils Absolute: 0.1 10*3/uL (ref 0.0–0.1)
Basophils Relative: 0 %
Eosinophils Absolute: 0.1 10*3/uL (ref 0.0–0.5)
Eosinophils Relative: 1 %
HCT: 34.8 % — ABNORMAL LOW (ref 36.0–46.0)
Hemoglobin: 11.1 g/dL — ABNORMAL LOW (ref 12.0–15.0)
Immature Granulocytes: 0 %
Lymphocytes Relative: 30 %
Lymphs Abs: 3.8 10*3/uL (ref 0.7–4.0)
MCH: 29.1 pg (ref 26.0–34.0)
MCHC: 31.9 g/dL (ref 30.0–36.0)
MCV: 91.3 fL (ref 80.0–100.0)
Monocytes Absolute: 0.8 10*3/uL (ref 0.1–1.0)
Monocytes Relative: 6 %
Neutro Abs: 8 10*3/uL — ABNORMAL HIGH (ref 1.7–7.7)
Neutrophils Relative %: 63 %
Platelets: 377 10*3/uL (ref 150–400)
RBC: 3.81 MIL/uL — ABNORMAL LOW (ref 3.87–5.11)
RDW: 13.8 % (ref 11.5–15.5)
WBC: 12.7 10*3/uL — ABNORMAL HIGH (ref 4.0–10.5)
nRBC: 0 % (ref 0.0–0.2)

## 2023-02-05 LAB — TYPE AND SCREEN
ABO/RH(D): A NEG
Antibody Screen: NEGATIVE

## 2023-02-05 LAB — COMPREHENSIVE METABOLIC PANEL
ALT: 15 U/L (ref 0–44)
AST: 15 U/L (ref 15–41)
Albumin: 3.5 g/dL (ref 3.5–5.0)
Alkaline Phosphatase: 52 U/L (ref 38–126)
Anion gap: 9 (ref 5–15)
BUN: 14 mg/dL (ref 6–20)
CO2: 24 mmol/L (ref 22–32)
Calcium: 8.6 mg/dL — ABNORMAL LOW (ref 8.9–10.3)
Chloride: 102 mmol/L (ref 98–111)
Creatinine, Ser: 0.79 mg/dL (ref 0.44–1.00)
GFR, Estimated: >60 mL/min (ref >60)
Glucose, Bld: 135 mg/dL — ABNORMAL HIGH (ref 70–99)
Potassium: 3 mmol/L — ABNORMAL LOW (ref 3.5–5.1)
Sodium: 135 mmol/L (ref 135–145)
Total Bilirubin: 0.2 mg/dL (ref 0.0–1.2)
Total Protein: 6.6 g/dL (ref 6.5–8.1)

## 2023-02-05 MED ORDER — MEGESTROL ACETATE 40 MG PO TABS: 40.0000 mg | ORAL_TABLET | Freq: Four times a day (QID) | ORAL | 1 refills | Status: DC

## 2023-02-05 MED ORDER — MEGESTROL ACETATE 40 MG PO TABS
40.0000 mg | ORAL_TABLET | Freq: Once | ORAL | Status: AC
  Administered 2023-02-05: 40 mg via ORAL
  Filled 2023-02-05: qty 1

## 2023-02-05 MED ORDER — POTASSIUM CHLORIDE CRYS ER 20 MEQ PO TBCR
40.0000 meq | EXTENDED_RELEASE_TABLET | Freq: Once | ORAL | Status: DC
  Filled 2023-02-05: qty 2

## 2023-02-05 NOTE — Discharge Instructions (Signed)
 You have been seen today for your complaint of abnormal uterine bleeding. Your lab work revealed a drop in hemoglobin from 14.5 to 11. Your discharge medications include Megace .  Stop the Provera  and begin the Megace  immediately.  Take as prescribed until the bleeding stops. Follow up with: Dr. Dallie as soon as possible.  You may also follow-up with another gynecologist if you choose, however this may take longer. Please seek immediate medical care if you develop any of the following symptoms: You faint. You have pain in the abdomen. You have a fever or chills. You become sweaty or weak. At this time there does not appear to be the presence of an emergent medical condition, however there is always the potential for conditions to change. Please read and follow the below instructions.  Do not take your medicine if  develop an itchy rash, swelling in your mouth or lips, or difficulty breathing; call 911 and seek immediate emergency medical attention if this occurs.  You may review your lab tests and imaging results in their entirety on your MyChart account.  Please discuss all results of fully with your primary care provider and other specialist at your follow-up visit.  Note: Portions of this text may have been transcribed using voice recognition software. Every effort was made to ensure accuracy; however, inadvertent computerized transcription errors may still be present.

## 2023-02-05 NOTE — ED Triage Notes (Addendum)
Pt reports with heavy vaginal bleeding with clots x 2 months. Pt has been seen several times and put on medications to stop the bleeding. Pt had an appt today with her ob/gyn but was 10 mins late and wasn't seen.

## 2023-02-05 NOTE — ED Provider Triage Note (Signed)
 Emergency Medicine Provider Triage Evaluation Note  Aimee Maldonado , a 57 y.o. female  was evaluated in triage.  Pt complains of heavy vaginal bleeding for the past 2 months.  Patient was seen at [facility] and given medication but her bleeding is worsening.  She went to [facility] office today, but was not able to be seen due to being 10 minutes late.  She reports passing large clots.   Review of Systems  Positive: As above Negative: As above  Physical Exam  BP (!) 168/86 (BP Location: Right Arm)   Pulse (!) 103   Temp 98.2 F (36.8 C) (Oral)   Resp 18   Ht 5' (1.524 m)   Wt 63.5 kg   LMP 11/17/2022 (Approximate)   SpO2 98%   BMI 27.34 kg/m  Gen:   Awake, no distress   Resp:  Normal effort  MSK:   Moves extremities without difficulty  Other:  Skin appears pale, face is flushed  Medical Decision Making  Medically screening exam initiated at 5:14 PM.  Appropriate orders placed.  Aveena Bari was informed that the remainder of the evaluation will be completed by another provider, this initial triage assessment does not replace that evaluation, and the importance of remaining in the ED until their evaluation is complete.     Gretta Gerard SAUN[place], [place] 02/05/23 [phone]

## 2023-02-05 NOTE — Telephone Encounter (Signed)
 Patient presented late for endometrial biopsy and was unable to be seen for appointment today.  However, she reported continued heavy vaginal bleeding and desire to be evaluated at the emergency department.  Patient was evaluated 01/23/2023 at ED with normal hemoglobin and transvaginal ultrasound with endometrial thickness of 5 mm. Progesterone was titrated up to 20 mg of Provera  3 times a day.  Called ahead to charge RN at [place] [facility] ED to make aware of patient's arrival.

## 2023-02-05 NOTE — Progress Notes (Signed)
 Patient ID: Aimee Maldonado, female   DOB: 1966/01/22, 57 y.o.   MRN: [id number]   OB/GYN Telephone Consult  02/05/2023   Aimee Maldonado is a 57 y.o. [id number] who is currently not pregnant presenting to [facility] ED.   I was called for a consult regarding the care of this patient by the PA caring for the patient.   The provider had the following clinical question: Abnormal bleeding continues Pt. Of GCG, though she is unhappy with her care due to not being seen today when she presented late for her appointment. They increased her Provera , but her bleeding continues and she feels weak.  The provider presented the following relevant clinical information: Hgb dropped from 14.7-->11.1  I performed a chart review on the patient and reviewed available documentation.  BP (!) 143/81 (BP Location: Right Arm)   Pulse 94   Temp 98.2 F (36.8 C) (Oral)   Resp 18   Ht 5' (1.524 m)   Wt 63.5 kg   LMP 11/17/2022 (Approximate)   SpO2 100%   BMI 27.34 kg/m   Exam- performed by consulting provider   Recommendations:  -Change to Megace  40 mg qid until bleeding stops, then decrease to 40 mg bid -f/u with gynecologist of choice -Bleeding precautions.   Thank you for this consult and if additional recommendations are needed please call [phone] for the OB/GYN attending on service at [facility].   I spent approximately 10 minutes directly consulting with the provider and verbally discussing this case. Additionally 10 minutes minutes was spent performing chart review and documentation.    Aimee GORMAN Birk, MD

## 2023-02-05 NOTE — ED Provider Notes (Signed)
 [place] EMERGENCY DEPARTMENT AT [facility] Provider Note   CSN: [id number] Arrival date & time: 02/05/23  1629     History  Chief Complaint  Patient presents with   Vaginal Bleeding    Aimee Maldonado is a 57 y.o. female.  With history of atrial fibrillation not currently anticoagulated, anxiety, depression, hypertension presenting for evaluation of abnormal uterine bleeding.  States she developed vaginal bleeding on November 1 which has progressively gotten worse.  She has been seen multiple times and started on progesterone which only briefly controlled her bleeding.  She had a follow-up appointment scheduled with [facility] gynecology today for a uterine biopsy but was reportedly 10 minutes late and was unable to be seen so she presents again to the emergency department.  She reports the pain has gotten significantly worse over the past 3 days.  She reports compliance with her medications.  She states she feels tired and weak.  She is passing large clots and bleeding through 2-3 pads per hour today.  She reports mild bilateral lower abdominal pain described as a cramping sensation.   Vaginal Bleeding      Home Medications Prior to Admission medications   Medication Sig Start Date End Date Taking? Authorizing Provider  megestrol  (MEGACE ) 40 MG tablet Take 1 tablet (40 mg total) by mouth 4 (four) times daily. 02/05/23  Yes Fredirick Glenys RAMAN, MD  megestrol  (MEGACE ) 40 MG tablet Take 1 tablet (40 mg total) by mouth 4 (four) times daily. 02/05/23  Yes Jakelin Taussig, Marsa HERO, PA-C  ALPRAZolam (XANAX) 0.25 MG tablet Take 1 tablet by mouth daily as needed.    [provider]  AUVELITY 45-105 MG TBCR Take 1 tablet by mouth daily. 05/17/22   [provider]  DULoxetine (CYMBALTA) 60 MG capsule Take by mouth daily. 12/29/19   [provider]  hydrochlorothiazide  (HYDRODIURIL ) 25 MG tablet Take 1 tablet (25 mg total) by mouth daily. 06/06/22   Levora Reyes SAUNDERS, MD  medroxyPROGESTERone  (PROVERA ) 5 MG tablet Take 4 tablets (20 mg total) by mouth in the morning, at noon, and at bedtime. 02/02/23   Ula Prentice SAUNDERS, MD  metoprolol  succinate (TOPROL -XL) 50 MG 24 hr tablet Take 1 tablet (50 mg total) by mouth daily. Take with or immediately following a meal. 06/06/22   Levora Reyes SAUNDERS, MD  naproxen  (NAPROSYN ) 500 MG tablet Take 1 tablet (500 mg total) by mouth 2 (two) times daily. 02/02/23   Ula Prentice SAUNDERS, MD  ondansetron  (ZOFRAN ) 4 MG tablet Take 1 tablet (4 mg total) by mouth every 6 (six) hours. 02/02/23   Ula Prentice SAUNDERS, MD  traZODone (DESYREL) 50 MG tablet Take 1-2 tablets by mouth at bedtime.    [provider]      Allergies    Patient has no known allergies.    Review of Systems   Review of Systems  Genitourinary:  Positive for vaginal bleeding.  All other systems reviewed and are negative.   Physical Exam Updated Vital Signs BP (!) 143/81 (BP Location: Right Arm)   Pulse 94   Temp 98.2 F (36.8 C) (Oral)   Resp 18   Ht 5' (1.524 m)   Wt 63.5 kg   LMP 11/17/2022 (Approximate)   SpO2 100%   BMI 27.34 kg/m  Physical Exam Vitals and nursing note reviewed. Exam conducted with a chaperone present Edwardo, RN).  Constitutional:      General: She is not in acute distress.    Appearance:  Normal appearance. She is normal weight. She is not ill-appearing.  HENT:     Head: Normocephalic and atraumatic.  Pulmonary:     Effort: Pulmonary effort is normal. No respiratory distress.  Abdominal:     General: Abdomen is flat.  Genitourinary:    Comments: Large amount of blood pooled on the vaginal canal with clots.  No active cervical bleeding Musculoskeletal:        General: Normal range of motion.     Cervical back: Neck supple.  Skin:    General: Skin is warm and dry.  Neurological:     Mental Status: She is alert and oriented to person, place, and time.  Psychiatric:        Mood and Affect: Mood normal.        Behavior:  Behavior normal.     ED Results / Procedures / Treatments   Labs (all labs ordered are listed, but only abnormal results are displayed) Labs Reviewed  CBC WITH DIFFERENTIAL/PLATELET - Abnormal; Notable for the following components:      Result Value   WBC 12.7 (*)    RBC 3.81 (*)    Hemoglobin 11.1 (*)    HCT 34.8 (*)    Neutro Abs 8.0 (*)    All other components within normal limits  COMPREHENSIVE METABOLIC PANEL - Abnormal; Notable for the following components:   Potassium 3.0 (*)    Glucose, Bld 135 (*)    Calcium  8.6 (*)    All other components within normal limits  TYPE AND SCREEN    EKG None  Radiology No results found.  Procedures Procedures    Medications Ordered in ED Medications  megestrol  (MEGACE ) tablet 40 mg (has no administration in time range)  potassium chloride  SA (KLOR-CON  M) CR tablet 40 mEq (has no administration in time range)    ED Course/ Medical Decision Making/ A&P Clinical Course as of 02/05/23 2329  Tue Feb 05, 2023  2255 Spoke with gynecology Dr. Fredirick who recommends switching to megace  TID and follow up with office as soon as possible [AS]    Clinical Course User Index [AS] Edwardo Marsa HERO, PA-C                                 Medical Decision Making This patient presents to the ED for concern of vaginal bleeding, this involves an extensive number of treatment options, and is a complaint that carries with it a high risk of complications and morbidity.  The differential diagnosis includes polyp, adenomyosis, leiomyoma, malignancy, coagulopathy, ovulatory dysfunction, iatrogenic  My initial workup includes labs  Additional history obtained from: Nursing notes from this visit. Previous records within EMR system ED visit on 02/02/2023, progesterone titrated up to 20 mg 3 times daily Family husband at bedside provides portion of the history  I ordered, reviewed and interpreted labs which include: CBC, CMP.  Leukocytosis of 12.7.   Hemoglobin decreased to 11.1 from 14.7 3 days ago  Consultations Obtained:  I requested consultation with gynecology Dr. Fredirick,  and discussed lab and imaging findings as well as pertinent plan - they recommend: Switch to Megace , follow-up with gynecology soon as possible  57 year old female presenting to the ED for evaluation of abnormal uterine bleeding.  This been present for the past 2 months.  Was initially started on Provera , had her dose increased 3 days ago.  Bleeding has since worsened.  She missed her  appointment for uterine biopsy today so she presents to the emergency department.  She denies any pain.  States she is bleeding through 2-3 pads per hour fairly consistently.  She reports feeling weak and lightheaded.  Lab workup with a drop in hemoglobin of 3 point in 3 days.  Physical exam reveals large amount of pooled blood and clots in the vaginal canal.  No active cervical hemorrhage on my evaluation.  Consulted with gynecology who recommends switching to Megace .  Prescription sent.  She was encouraged to follow-up with her gynecologist as soon as possible.  She was given return precautions.  Stable at discharge.  At this time there does not appear to be any evidence of an acute emergency medical condition and the patient appears stable for discharge with appropriate outpatient follow up. Diagnosis was discussed with patient who verbalizes understanding of care plan and is agreeable to discharge. I have discussed return precautions with patient and significant other who verbalizes understanding. Patient encouraged to follow-up with their gynecologist. All questions answered.  Note: Portions of this report may have been transcribed using voice recognition software. Every effort was made to ensure accuracy; however, inadvertent computerized transcription errors may still be present.         Final Clinical Impression(s) / ED Diagnoses Final diagnoses:  Abnormal uterine bleeding (AUB)     Rx / DC Orders ED Discharge Orders          Ordered    megestrol  (MEGACE ) 40 MG tablet  4 times daily        02/05/23 2255    megestrol  (MEGACE ) 40 MG tablet  4 times daily        02/05/23 2311              Edwardo Marsa CHRISTELLA DEVONNA 02/05/23 2329    Trine Raynell Moder, MD 02/06/23 [phone]

## 2023-02-05 NOTE — ED Notes (Signed)
Assisted PA with pelvic exam. Pt provided disposable underwear & sanitary pad to change into afterwards.

## 2023-02-07 ENCOUNTER — Ambulatory Visit: Payer: 59 | Admitting: Obstetrics and Gynecology

## 2023-02-07 ENCOUNTER — Encounter: Payer: Self-pay | Admitting: Obstetrics and Gynecology

## 2023-02-07 VITALS — BP 134/82 | HR 92 | Ht 60.0 in | Wt 140.0 lb

## 2023-02-07 DIAGNOSIS — Z01812 Encounter for preprocedural laboratory examination: Secondary | ICD-10-CM | POA: Diagnosis not present

## 2023-02-07 DIAGNOSIS — N921 Excessive and frequent menstruation with irregular cycle: Secondary | ICD-10-CM

## 2023-02-07 LAB — CBC
HCT: 29.5 % — ABNORMAL LOW (ref 35.0–45.0)
Hemoglobin: 9.3 g/dL — ABNORMAL LOW (ref 11.7–15.5)
MCH: 28.5 pg (ref 27.0–33.0)
MCHC: 31.5 g/dL — ABNORMAL LOW (ref 32.0–36.0)
MCV: 90.5 fL (ref 80.0–100.0)
MPV: 10.4 fL (ref 7.5–12.5)
Platelets: 434 10*3/uL — ABNORMAL HIGH (ref 140–400)
RBC: 3.26 10*6/uL — ABNORMAL LOW (ref 3.80–5.10)
RDW: 13.1 % (ref 11.0–15.0)
WBC: 11.4 10*3/uL — ABNORMAL HIGH (ref 3.8–10.8)

## 2023-02-07 LAB — PREGNANCY, URINE: Preg Test, Ur: NEGATIVE

## 2023-02-07 MED ORDER — IRON (FERROUS SULFATE) 325 (65 FE) MG PO TABS: 325.0000 mg | ORAL_TABLET | Freq: Every day | ORAL | 0 refills | Status: DC

## 2023-02-07 NOTE — Patient Instructions (Addendum)
 Total Laparoscopic Hysterectomy A total laparoscopic hysterectomy is a minimally invasive surgery to remove the uterus and cervix. The fallopian tubes and ovaries can also be removed during this surgery, if necessary. This procedure may be done to treat problems such as: Growths in the uterus (uterine fibroids) that are not cancer but cause symptoms. A condition that causes the lining of the uterus to grow in other areas (endometriosis). Problems with pelvic support. Cancer of the cervix, ovaries, uterus, or tissue that lines the uterus (endometrium). Excessive bleeding in the uterus. After this procedure, you will no longer be able to have a baby, and you will no longer have a menstrual period. Tell a health care provider about: Any allergies you have. All medicines you are taking, including vitamins, herbs, eye drops, creams, and over-the-counter medicines. Any problems you or family members have had with anesthetic medicines. Any blood disorders you have. Any surgeries you have had. Any medical conditions you have. Whether you are pregnant or may be pregnant. What are the risks? Generally, this is a safe procedure. However, problems may occur, including: Infection. Bleeding. Blood clots in the legs or lungs. Allergic reactions to medicines. Damage to nearby structures or organs. Having to change from this surgery to one in which a large incision is made in the abdomen (abdominal hysterectomy). What happens before the procedure? Staying hydrated Follow instructions from your health care provider about hydration, which may include: Up to 2 hours before the procedure - you may continue to drink clear liquids, such as water , clear fruit juice, black coffee, and plain tea.  Eating and drinking restrictions Follow instructions from your health care provider about eating and drinking, which may include: 8 hours before the procedure - stop eating heavy meals or foods, such as meat, fried  foods, or fatty foods. 6 hours before the procedure - stop eating light meals or foods, such as toast or cereal. 6 hours before the procedure - stop drinking milk or drinks that contain milk. 2 hours before the procedure - stop drinking clear liquids. Medicines Take over-the-counter and prescription medicines only as told by your health care provider. You may be asked to take medicine that helps you have a bowel movement (laxative) to prevent constipation. General instructions If you were asked to do bowel preparation before the procedure, follow instructions from your health care provider. This procedure can affect the way you feel about yourself. Talk with your health care provider about the physical and emotional changes hysterectomy may cause. Do not use any products that contain nicotine or tobacco for at least 4 weeks before the procedure. These products include cigarettes, chewing tobacco, and vaping devices, such as e-cigarettes. If you need help quitting, ask your health care provider. Plan to have a responsible adult take you home from the hospital or clinic. Plan to have a responsible adult care for you for the time you are told after you leave the hospital or clinic. This is important. Surgery safety Ask your health care provider: How your surgery site will be marked. What steps will be taken to help prevent infection. These may include: Removing hair at the surgery site. Washing skin with a germ-killing soap. Receiving antibiotic medicine. What happens during the procedure? An IV will be inserted into one of your veins. You will be given one or more of the following: A medicine to help you relax (sedative). A medicine to make you fall asleep (general anesthetic). A medicine to numb the area (local anesthetic). A medicine  that is injected into your spine to numb the area below and slightly above the injection site (spinal anesthetic). A medicine that is injected into an area of  your body to numb everything below the injection site (regional anesthetic). A gas will be used to inflate your abdomen. This will allow your surgeon to look inside your abdomen and do the surgery. Three or four small incisions will be made in your abdomen. A small device with a light (laparoscope) will be inserted into one of your incisions. Surgical instruments will be inserted through the other incisions in order to perform the procedure. Your uterus and cervix may be removed through your vagina or cut into small pieces and removed through the small incisions. Any other organs that need to be removed will also be removed this way. The gas will be released from inside your abdomen. Your incisions will be closed with stitches (sutures), skin glue, or adhesive strips. A bandage (dressing) may be placed over your incisions. The procedure may vary among health care providers and hospitals. What happens after the procedure? Your blood pressure, heart rate, breathing rate, and blood oxygen level will be monitored until you leave the hospital or clinic. You will be given medicine for pain as needed. You will be encouraged to walk as soon as possible. You will also use a device to help you breathe or do breathing exercises to keep your lungs clear. You may have to wear compression stockings. These stockings help to prevent blood clots and reduce swelling in your legs. You will need to wear a sanitary pad for vaginal discharge or bleeding. Summary Total laparoscopic hysterectomy is a procedure to remove your uterus, cervix, and sometimes the fallopian tubes and ovaries. This procedure can affect the way you feel about yourself. Talk with your health care provider about the physical and emotional changes hysterectomy may cause. After this procedure, you will no longer be able to have a baby, and you will no longer have a menstrual period. You will be given pain medicine to control discomfort after this  procedure. Plan to have a responsible adult take you home from the hospital or clinic. This information is not intended to replace advice given to you by your health care provider. Make sure you discuss any questions you have with your health care provider. Document Revised: 04/05/2021 Document Reviewed: 09/25/2019 Elsevier Patient Education  2024 [employer]   Dilation and Curettage or Vacuum Curettage Dilation and curettage (D&C) and vacuum curettage are minor procedures. A D&C involves stretching the cervix (dilation) and scraping the inside lining of the uterus, or endometrium, with surgical instruments (curettage). During a D&C, tissue is gently scraped starting from the top of the uterus down to the lowest part of the uterus. During a vacuum curettage, the lining and tissue in the uterus are removed using gentle suction. Curettage may be performed to either diagnose or treat a problem. A diagnostic curettage may be done if you have: Irregular bleeding or clotting from the uterus. Spotting between menstrual periods, prolonged menstrual periods, or other abnormal bleeding. Bleeding after menopause. No menstrual period (amenorrhea). A change in the size and shape of the uterus. Abnormal endometrial cells discovered during a Pap test. For treatment, curettage may be done: To remove an IUD (intrauterine device). To remove the remaining placenta after giving birth. During an abortion or after a miscarriage. To remove growths in the lining of the uterus. To remove certain rare types of noncancerous lumps (fibroids). Tell a health  care provider about: Any allergies you have, including allergies to prescribed medicine or latex. All medicines you are taking, including vitamins, herbs, eye drops, creams, and over-the-counter medicines. Any problems you or family members have had with anesthetic medicines. Any blood disorders you have. Any surgeries you have had. Your medical history and any  medical conditions you have. Whether you are pregnant or may be pregnant. Recent vaginal infections you have had. Recent menstrual periods, bleeding problems you have had, and what form of birth control (contraception) you use. What are the risks? Generally, this is a safe procedure. However, problems may occur, including: Infection. Heavy vaginal bleeding. Allergic reactions to medicines. Damage to the cervix or nearby structures or organs. Scar tissue developing inside the uterus. This can cause abnormal periods and may make it harder to get pregnant. A hole (perforation) in the wall of the uterus. This is rare. What happens before the procedure? Staying hydrated Follow instructions from your health care provider about hydration, which may include: Up to 2 hours before the procedure - you may continue to drink clear liquids, such as water , clear fruit juice, black coffee, and plain tea.  Eating and drinking restrictions Follow instructions from your health care provider about eating and drinking, which may include: 8 hours before the procedure - stop eating heavy meals or foods, such as meat, fried foods, or fatty foods. 6 hours before the procedure - stop eating light meals or foods, such as toast or cereal. 6 hours before the procedure - stop drinking milk or drinks that contain milk. 2 hours before the procedure - stop drinking clear liquids. If your health care provider told you to take your medicine on the day of your procedure, take them with only a sip of water . Medicines Ask your health care provider about: Changing or stopping your regular medicines. This is especially important if you are taking diabetes medicines or blood thinners. Taking medicines such as aspirin and ibuprofen . These medicines can thin your blood. Do not take these medicines unless your health care provider tells you to take them. Taking over-the-counter medicines, vitamins, herbs, and supplements. You may  be given a medicine to soften the cervix. This will help with dilation. Tests You may be given a pregnancy test on the day of the procedure. You may have a blood or urine sample taken. General instructions Do not use any products that contain nicotine or tobacco for at least 4 weeks before the procedure. These products include cigarettes, chewing tobacco, and vaping devices, such as e-cigarettes. If you need help quitting, ask your health care provider. For 24 hours before your procedure: Do not douche, use tampons, or have sex. Do not use medicines, creams, or suppositories in the vagina. Ask your health care provider what steps will be taken to help prevent infection. These may include: Removing hair at the procedure site. Washing skin with a germ-killing soap. Taking antibiotic medicine. Plan to have a responsible adult take you home from the hospital or clinic. If you will be going home right after the procedure, plan to have a responsible adult care for you for the time you are told. This is important. What happens during the procedure?  An IV will be inserted into one of your veins. You will be given one of the following: A medicine that numbs the area in and around the cervix (local anesthetic). A medicine to make you fall asleep (general anesthetic). You will lie down on your back, with your feet in  foot rests (stirrups). The size and position of your uterus will be checked. A lubricated instrument (speculum or Sims retractor) will be inserted into your vagina to widen its walls. This will allow your health care provider to see your cervix. Your cervix will be softened and dilated. This may be done by: Taking medicine by mouth or vaginally. Having thin rods or gradual widening instruments inserted into your cervix. A small, sharp, curved instrument (curette) will be used to scrape a small amount of tissue or cells from the endometrium or cervical canal. In some cases, gentle  suction is applied with the curette. The cells will be taken to a lab for testing. The procedure may vary among health care providers and hospitals. What happens after the procedure? Your blood pressure, heart rate, breathing rate, and blood oxygen level will be monitored until you leave the hospital or clinic. You may have mild cramping, a backache, pain, and light bleeding or spotting. You may pass small blood clots from your vagina. You may have to wear compression stockings. These stockings help to prevent blood clots and reduce swelling in your legs. It is up to you to get the results of your procedure. Ask your health care provider, or the department that is doing the procedure, when your results will be ready. Summary Dilation and curettage (D&C) involves stretching (dilating) the cervix and scraping the inside lining of the uterus with surgical instruments (curettage). Follow your health care provider's instructions about when to stop eating and drinking before the procedure, and whether to stop or change any medicines. After the procedure, you may have mild cramping, a backache, pain, and light bleeding or spotting. You may pass small blood clots from your vagina. Plan to have a responsible adult take you home from the hospital or clinic. This information is not intended to replace advice given to you by your health care provider. Make sure you discuss any questions you have with your health care provider. Document Revised: 01/13/2020 Document Reviewed: 01/13/2020 Elsevier Patient Education  2024 [employer].

## 2023-02-07 NOTE — Progress Notes (Signed)
 GYNECOLOGY  VISIT   HPI: 58 y.o.   Married  Caucasian female   [phone] with Patient's last menstrual period was 11/17/2022 (approximate).   here for: possible endometrial biopsy for anovulatory bleeding. Pt states she had UPT done at ED but unable to leave urine specimen now.    Started bleeding November 1.  Currently changing a pad every hour for two weeks.  She states she is feeling poorly and wants the bleeding to stop. Notes some increased heart rate.  Reports atrial fibrillation during pregnancy.  Hgb 15.9 on 01/10/23, 14.7 on 02/02/23, and 11.1 on 02/05/23.   No UPT done at ER visits on either 12/28 or 12/31.  Negative serum hGC on 01/11/23.    Currently taking Megace  since yesterday, but still bleeding and clotting.   Pelvic US  done 02/02/23 through [facility]: Study Result  Narrative & Impression  CLINICAL DATA:  Two-month history of heavy vaginal bleeding   EXAM: TRANSABDOMINAL AND TRANSVAGINAL ULTRASOUND OF PELVIS   TECHNIQUE: Both transabdominal and transvaginal ultrasound examinations of the pelvis were performed. Transabdominal technique was performed for global imaging of the pelvis including uterus, ovaries, adnexal regions, and pelvic cul-de-sac. It was necessary to proceed with endovaginal exam following the transabdominal exam to visualize the pelvic structures.   COMPARISON:  None Available.   FINDINGS: Uterus   Measurements: 8.5 cm in sagittal dimension. No fibroids or other mass visualized. Retroflexed uterus. Nabothian cysts.   Endometrium   Thickness: 5 mm.  No focal abnormality visualized.   Right ovary   Not seen.   Left ovary   Not seen.   Other findings:  No abnormal free fluid.   Technically challenging examination due to bowel gas.   IMPRESSION: 1. Technically challenging examination due to bowel gas. Within this context, normal sonographic appearance of the uterus and endometrium. 2. Nonvisualization of  the ovaries.     Electronically Signed   By: Limin  Xu M.D.   On: 02/02/2023 19:22      Has had dilation and curettage procedures related to miscarriage.  GYNECOLOGIC HISTORY: Patient's last menstrual period was 11/17/2022 (approximate). Contraception:  n/a Menopausal hormone therapy:  NA Last 2 paps:  08/14/22 neg: HR HPV neg History of abnormal Pap or positive HPV:  no Mammogram:  04/12/22 BI-RADS CAT 2 benign        OB History     Gravida  5   Para  2   Term      Preterm      AB  3   Living  2      SAB      IAB      Ectopic      Multiple      Live Births                 Patient Active Problem List   Diagnosis Date Noted   Abnormal uterine bleeding (AUB) 01/11/2023   MDD (major depressive disorder) 01/11/2023   GAD (generalized anxiety disorder) 01/11/2023   Hypertension 01/11/2023   SVT (supraventricular tachycardia) (HCC) 08/11/2020    Past Medical History:  Diagnosis Date   Anxiety    Atrial fibrillation    Blood transfusion without reported diagnosis    Depression    Hypertension     Past Surgical History:  Procedure Laterality Date   CESAREAN SECTION  05/2009   LIPOSUCTION  03/2021   Sonobello   mscarrigage      Current Outpatient Medications  Medication Sig Dispense Refill   ALPRAZolam (XANAX) 0.25 MG tablet Take 1 tablet by mouth daily as needed.     AUVELITY 45-105 MG TBCR Take 1 tablet by mouth daily.     DULoxetine (CYMBALTA) 60 MG capsule Take by mouth daily.     hydrochlorothiazide  (HYDRODIURIL ) 25 MG tablet Take 1 tablet (25 mg total) by mouth daily. 90 tablet 1   megestrol  (MEGACE ) 40 MG tablet Take 1 tablet (40 mg total) by mouth 4 (four) times daily. 120 tablet 1   megestrol  (MEGACE ) 40 MG tablet Take 1 tablet (40 mg total) by mouth 4 (four) times daily. 120 tablet 1   metoprolol  succinate (TOPROL -XL) 50 MG 24 hr tablet Take 1 tablet (50 mg total) by mouth daily. Take with or immediately following a meal. 90 tablet 1    naproxen  (NAPROSYN ) 500 MG tablet Take 1 tablet (500 mg total) by mouth 2 (two) times daily. 30 tablet 0   ondansetron  (ZOFRAN ) 4 MG tablet Take 1 tablet (4 mg total) by mouth every 6 (six) hours. 12 tablet 0   traZODone (DESYREL) 50 MG tablet Take 1-2 tablets by mouth at bedtime.     No current facility-administered medications for this visit.     ALLERGIES: Patient has no known allergies.  Family History  Problem Relation Age of Onset   Diabetes Mother    Hypertension Mother    Anxiety disorder Mother    Miscarriages / Stillbirths Mother    Diabetes Father    Hyperlipidemia Brother    Cerebral palsy Brother    Anxiety disorder Daughter    Depression Daughter    Anxiety disorder Son     Social History   Socioeconomic History   Marital status: Married    Spouse name: Not on file   Number of children: Not on file   Years of education: Not on file   Highest education level: Not on file  Occupational History   Not on file  Tobacco Use   Smoking status: Never    Passive exposure: Never   Smokeless tobacco: Never  Vaping Use   Vaping status: Never Used  Substance and Sexual Activity   Alcohol use: No   Drug use: No   Sexual activity: Yes    Partners: Male    Birth control/protection: None    Comment: menarche 58yo, sexual debut 58yo  Other Topics Concern   Not on file  Social History Narrative   Not on file   Social Drivers of Health   Financial Resource Strain: Patient Declined (10/24/2022)   Received from [facility]   Overall Financial Resource Strain (CARDIA)    Difficulty of Paying Living Expenses: Patient declined  Food Insecurity: Patient Declined (10/24/2022)   Received from [facility]   Hunger Vital Sign    Worried About Running Out of Food in the Last Year: Patient declined    Ran Out of Food in the Last Year: Patient declined  Transportation Needs: Patient Declined (10/24/2022)   Received from [facility] - Transportation    Lack of  Transportation (Medical): Patient declined    Lack of Transportation (Non-Medical): Patient declined  Physical Activity: Not on file  Stress: Not on file  Social Connections: Unknown (09/28/2022)   Received from [facility]   Social Network    Social Network: Not on file  Intimate Partner Violence: Unknown (09/28/2022)   Received from [facility]   HITS    Physically Hurt: Not on file  Insult or Talk Down To: Not on file    Threaten Physical Harm: Not on file    Scream or Curse: Not on file    Review of Systems  All other systems reviewed and are negative.   PHYSICAL EXAMINATION:   BP 134/82 (BP Location: Left Arm, Patient Position: Sitting, Cuff Size: Small)   Pulse 92   Ht 5' (1.524 m)   Wt 140 lb (63.5 kg)   LMP 11/17/2022 (Approximate)   SpO2 98%   BMI 27.34 kg/m     General appearance: lying down, pale.   Pelvic: External genitalia:  no lesions              Urethra:  normal appearing urethra with no masses, tenderness or lesions              Bartholins and Skenes: normal                 Vagina: normal appearing vagina with normal color and discharge, no lesions.  Tenderness with speculum exam.  Dark clot in the vagina and no active bleeding from the cervix.               Cervix: no lesions                Bimanual Exam:  Uterus:  bulky and tender.               Adnexa: no mass, fullness, tenderness          Chaperone was present for exam:  Damien FALCON, CMA  ASSESSMENT:  Menorrhagia with irregular menses.   PLAN:  We reviewed her ultrasound. UPT today:  negative. CBC.  Iron  sulfate 325 mg po daily.  We discussed endometrial biopsy today versus proceeding directly to hysteroscopy and dilation and curettage.  Patient would like to move forward surgically, and she will have a consultation with Dr. Glennon tomorrow.  She will continue her Megace .  Anemia precautions.    25 min  total time was spent for this patient encounter, including preparation,  face-to-face counseling with the patient, coordination of care, and documentation of the encounter.

## 2023-02-08 ENCOUNTER — Other Ambulatory Visit (HOSPITAL_COMMUNITY)
Admission: RE | Admit: 2023-02-08 | Discharge: 2023-02-08 | Disposition: A | Discharge status: Home / Self Care | Payer: 59 | Source: Ambulatory Visit | Attending: Obstetrics and Gynecology | Admitting: Obstetrics and Gynecology

## 2023-02-08 ENCOUNTER — Encounter (HOSPITAL_BASED_OUTPATIENT_CLINIC_OR_DEPARTMENT_OTHER): Payer: Self-pay | Admitting: Obstetrics and Gynecology

## 2023-02-08 ENCOUNTER — Ambulatory Visit: Payer: 59 | Admitting: Obstetrics and Gynecology

## 2023-02-08 ENCOUNTER — Encounter: Payer: Self-pay | Admitting: Obstetrics and Gynecology

## 2023-02-08 VITALS — BP 136/74 | HR 91 | Ht 60.0 in | Wt 140.0 lb

## 2023-02-08 DIAGNOSIS — N946 Dysmenorrhea, unspecified: Secondary | ICD-10-CM | POA: Diagnosis present

## 2023-02-08 DIAGNOSIS — N921 Excessive and frequent menstruation with irregular cycle: Secondary | ICD-10-CM

## 2023-02-08 DIAGNOSIS — D500 Iron deficiency anemia secondary to blood loss (chronic): Secondary | ICD-10-CM | POA: Diagnosis present

## 2023-02-08 DIAGNOSIS — N8003 Adenomyosis of the uterus: Secondary | ICD-10-CM | POA: Diagnosis present

## 2023-02-08 NOTE — Progress Notes (Signed)
 58 y.o. y.o. female here for surgical consult for uncontrollable heavy bleeding. Has been bleeding non stop since November with gushing of blood 10 pads a day and clots.  Could not do the EMB due to profuse bleeding.  Reports she is taking megace  4 times a day now and reports it is lighter today, but is feeling weak and tired. Has pale appearance. Has not started iron  yet. Has not stopped bleeding for a year. Last FSH 8.8 Has been to see several providers for the bleeding and ER visits and is frustrated with the bleeding and pain and would like a definitive management with the [facility].  She feels she can do an EMB today. She is leaning towards ovarian preservation, since she has not menopausal symptoms.  To get estrogen level today.  H/o T exstension with LTCS  Patient's last menstrual period was 11/17/2022 (approximate).    Hgb 15.9 on 01/10/23, 14.7 on 02/02/23, and 11.1 on 02/05/23. Yesterday in ER HG 9  No UPT done at ER visits on either 12/28 or 12/31.  Negative serum hGC on 01/11/23.    Currently taking Megace  since yesterday, but still bleeding and clotting.   Pelvic US  done 02/02/23 through [facility]: Study Result  Narrative & Impression  CLINICAL DATA:  Two-month history of heavy vaginal bleeding   EXAM: TRANSABDOMINAL AND TRANSVAGINAL ULTRASOUND OF PELVIS   TECHNIQUE: Both transabdominal and transvaginal ultrasound examinations of the pelvis were performed. Transabdominal technique was performed for global imaging of the pelvis including uterus, ovaries, adnexal regions, and pelvic cul-de-sac. It was necessary to proceed with endovaginal exam following the transabdominal exam to visualize the pelvic structures.   COMPARISON:  None Available.   FINDINGS: Uterus   Measurements: 8.5 cm in sagittal dimension. No fibroids or other mass visualized. Retroflexed uterus. Nabothian cysts.   Endometrium   Thickness: 5 mm.  No focal abnormality  visualized.   Right ovary   Not seen.   Left ovary   Not seen.   Other findings:  No abnormal free fluid.   Technically challenging examination due to bowel gas.   IMPRESSION: 1. Technically challenging examination due to bowel gas. Within this context, normal sonographic appearance of the uterus and endometrium. 2. Nonvisualization of the ovaries.     Electronically Signed   By: Limin  Xu M.D.   On: 02/02/2023 19:22      Has had dilation and curettage procedures related to miscarriage.  GYNECOLOGIC HISTORY: Patient's last menstrual period was 11/17/2022 (approximate). Contraception:  n/a Menopausal hormone therapy:  NA Last 2 paps:  08/14/22 neg: HR HPV neg History of abnormal Pap or positive HPV:  no Mammogram:  04/12/22 BI-RADS CAT 2 benign         Body mass index is 27.34 kg/m.     01/10/2023    1:13 PM 07/11/2022    1:14 PM 06/06/2022    1:09 PM  Depression screen PHQ 2/9  Decreased Interest 2 1 2   Down, Depressed, Hopeless 2 1 2   PHQ - 2 Score 4 2 4   Altered sleeping 3 1 2   Tired, decreased energy 3 1 3   Change in appetite 2 0 1  Feeling bad or failure about yourself  3 2 1   Trouble concentrating 0 0 1  Moving slowly or fidgety/restless 0 0 0  Suicidal thoughts 1 1 2   PHQ-9 Score 16 7 14   Difficult doing work/chores Very difficult  Very difficult    Blood pressure 136/74, pulse  91, height 5' (1.524 m), weight 140 lb (63.5 kg), last menstrual period 11/17/2022, SpO2 100%.     Component Value Date/Time   DIAGPAP  08/14/2022 1357    - Negative for intraepithelial lesion or malignancy (NILM)   HPVHIGH Negative 08/14/2022 1357   ADEQPAP  08/14/2022 1357    Satisfactory for evaluation; transformation zone component ABSENT.    GYN HISTORY:    Component Value Date/Time   DIAGPAP  08/14/2022 1357    - Negative for intraepithelial lesion or malignancy (NILM)   HPVHIGH Negative 08/14/2022 1357   ADEQPAP  08/14/2022 1357    Satisfactory for evaluation;  transformation zone component ABSENT.    OB History  Gravida Para Term Preterm AB Living  5 2   3 2   SAB IAB Ectopic Multiple Live Births          # Outcome Date GA Lbr Len/2nd Weight Sex Type Anes PTL Lv  5 AB           4 AB           3 AB           2 Para           1 Para             Past Medical History:  Diagnosis Date   Anxiety    Atrial fibrillation    Blood transfusion without reported diagnosis    Depression    Hypertension     Past Surgical History:  Procedure Laterality Date   CESAREAN SECTION  05/2009   LIPOSUCTION  03/2021   Sonobello   mscarrigage      Current Outpatient Medications on File Prior to Visit  Medication Sig Dispense Refill   ALPRAZolam (XANAX) 0.25 MG tablet Take 1 tablet by mouth daily as needed.     AUVELITY 45-105 MG TBCR Take 1 tablet by mouth daily.     DULoxetine (CYMBALTA) 60 MG capsule Take by mouth daily.     hydrochlorothiazide  (HYDRODIURIL ) 25 MG tablet Take 1 tablet (25 mg total) by mouth daily. 90 tablet 1   Iron , Ferrous Sulfate , 325 (65 Fe) MG TABS Take 325 mg by mouth daily. 30 tablet 0   megestrol  (MEGACE ) 40 MG tablet Take 1 tablet (40 mg total) by mouth 4 (four) times daily. 120 tablet 1   megestrol  (MEGACE ) 40 MG tablet Take 1 tablet (40 mg total) by mouth 4 (four) times daily. 120 tablet 1   metoprolol  succinate (TOPROL -XL) 50 MG 24 hr tablet Take 1 tablet (50 mg total) by mouth daily. Take with or immediately following a meal. 90 tablet 1   naproxen  (NAPROSYN ) 500 MG tablet Take 1 tablet (500 mg total) by mouth 2 (two) times daily. 30 tablet 0   ondansetron  (ZOFRAN ) 4 MG tablet Take 1 tablet (4 mg total) by mouth every 6 (six) hours. 12 tablet 0   traZODone (DESYREL) 50 MG tablet Take 1-2 tablets by mouth at bedtime.     No current facility-administered medications on file prior to visit.    Social History   Socioeconomic History   Marital status: Married    Spouse name: Not on file   Number of children: Not on  file   Years of education: Not on file   Highest education level: Not on file  Occupational History   Not on file  Tobacco Use   Smoking status: Never    Passive exposure: Never   Smokeless tobacco: Never  Vaping Use   Vaping status: Never Used  Substance and Sexual Activity   Alcohol use: No   Drug use: No   Sexual activity: Yes    Partners: Male    Birth control/protection: None    Comment: menarche 58yo, sexual debut 58yo  Other Topics Concern   Not on file  Social History Narrative   Not on file   Social Drivers of Health   Financial Resource Strain: Patient Declined (10/24/2022)   Received from [facility]   Overall Financial Resource Strain (CARDIA)    Difficulty of Paying Living Expenses: Patient declined  Food Insecurity: Patient Declined (10/24/2022)   Received from [facility]   Hunger Vital Sign    Worried About Running Out of Food in the Last Year: Patient declined    Ran Out of Food in the Last Year: Patient declined  Transportation Needs: Patient Declined (10/24/2022)   Received from [facility]   PRAPARE - Transportation    Lack of Transportation (Medical): Patient declined    Lack of Transportation (Non-Medical): Patient declined  Physical Activity: Not on file  Stress: Not on file  Social Connections: Unknown (09/28/2022)   Received from [facility]   Social Network    Social Network: Not on file  Intimate Partner Violence: Unknown (09/28/2022)   Received from [facility]   HITS    Physically Hurt: Not on file    Insult or Talk Down To: Not on file    Threaten Physical Harm: Not on file    Scream or Curse: Not on file    Family History  Problem Relation Age of Onset   Diabetes Mother    Hypertension Mother    Anxiety disorder Mother    Miscarriages / Stillbirths Mother    Diabetes Father    Hyperlipidemia Brother    Cerebral palsy Brother    Anxiety disorder Daughter    Depression Daughter    Anxiety disorder Son      No Known  Allergies    Patient's last menstrual period was Patient's last menstrual period was 11/17/2022 (approximate).SABRA           PROCEDURE: EMB Procedure explained and r/b/a/I and importance of the EMB was discussed. Consent obtained for the procedure.  Timeout performed. A bivalve speculum was placed in the vagina.  The cervix was grasped with a single tooth tenaculum.  Pipelle was inserted and rotated.  Uterus sound to 8cm.  Adequate specimen was obtained and sent to pathology.  All instruments were removed.  Patient tolerated the procedure well.  To notify patient of the results.   Review of Systems Alls systems reviewed and are negative.     OBGyn Exam    A:         EMB, menorrhagia, dysmenorrhea, anemia, likely adenomyosis                             P:        EMB collected today.  Encouraged patient to start the iron  treatment   Counseled on all options.  She would like to have the [facility].  Counseled extensively on the procedure including but not limited to what to expect and risks and benefits.  Counseled on postop care and pelvic rest for 10 weeks after the surgery with restricted lifting for 6-8 weeks after.  Counseled on the benefits of the robotic procedure with faster return to daily activities, improved outcomes, and less  risk for complications. She would like to have this scheduled.  Continue megace  until surgery.  Placed urgent request for [facility]. Repeat cbc today. No follow-ups on file.  30 minutes spent on reviewing records, imaging,  and one on one patient time and counseling patient and documentation Dr. Glennon Almarie MARLA Glennon

## 2023-02-08 NOTE — H&P (View-Only) (Signed)
 58 y.o. y.o. female here for surgical consult for uncontrollable heavy bleeding. Has been bleeding non stop since November with gushing of blood 10 pads a day and clots.  Could not do the EMB due to profuse bleeding.  Reports she is taking megace  4 times a day now and reports it is lighter today, but is feeling weak and tired. Has pale appearance. Has not started iron  yet. Has not stopped bleeding for a year. Last FSH 8.8 Has been to see several providers for the bleeding and ER visits and is frustrated with the bleeding and pain and would like a definitive management with the [facility].  She feels she can do an EMB today. She is leaning towards ovarian preservation, since she has not menopausal symptoms.  To get estrogen level today.  H/o T exstension with LTCS  Patient's last menstrual period was 11/17/2022 (approximate).    Hgb 15.9 on 01/10/23, 14.7 on 02/02/23, and 11.1 on 02/05/23. Yesterday in ER HG 9  No UPT done at ER visits on either 12/28 or 12/31.  Negative serum hGC on 01/11/23.    Currently taking Megace  since yesterday, but still bleeding and clotting.   Pelvic US  done 02/02/23 through [facility]: Study Result  Narrative & Impression  CLINICAL DATA:  Two-month history of heavy vaginal bleeding   EXAM: TRANSABDOMINAL AND TRANSVAGINAL ULTRASOUND OF PELVIS   TECHNIQUE: Both transabdominal and transvaginal ultrasound examinations of the pelvis were performed. Transabdominal technique was performed for global imaging of the pelvis including uterus, ovaries, adnexal regions, and pelvic cul-de-sac. It was necessary to proceed with endovaginal exam following the transabdominal exam to visualize the pelvic structures.   COMPARISON:  None Available.   FINDINGS: Uterus   Measurements: 8.5 cm in sagittal dimension. No fibroids or other mass visualized. Retroflexed uterus. Nabothian cysts.   Endometrium   Thickness: 5 mm.  No focal abnormality  visualized.   Right ovary   Not seen.   Left ovary   Not seen.   Other findings:  No abnormal free fluid.   Technically challenging examination due to bowel gas.   IMPRESSION: 1. Technically challenging examination due to bowel gas. Within this context, normal sonographic appearance of the uterus and endometrium. 2. Nonvisualization of the ovaries.     Electronically Signed   By: Limin  Xu M.D.   On: 02/02/2023 19:22      Has had dilation and curettage procedures related to miscarriage.  GYNECOLOGIC HISTORY: Patient's last menstrual period was 11/17/2022 (approximate). Contraception:  n/a Menopausal hormone therapy:  NA Last 2 paps:  08/14/22 neg: HR HPV neg History of abnormal Pap or positive HPV:  no Mammogram:  04/12/22 BI-RADS CAT 2 benign         Body mass index is 27.34 kg/m.     01/10/2023    1:13 PM 07/11/2022    1:14 PM 06/06/2022    1:09 PM  Depression screen PHQ 2/9  Decreased Interest 2 1 2   Down, Depressed, Hopeless 2 1 2   PHQ - 2 Score 4 2 4   Altered sleeping 3 1 2   Tired, decreased energy 3 1 3   Change in appetite 2 0 1  Feeling bad or failure about yourself  3 2 1   Trouble concentrating 0 0 1  Moving slowly or fidgety/restless 0 0 0  Suicidal thoughts 1 1 2   PHQ-9 Score 16 7 14   Difficult doing work/chores Very difficult  Very difficult    Blood pressure 136/74, pulse  91, height 5' (1.524 m), weight 140 lb (63.5 kg), last menstrual period 11/17/2022, SpO2 100%.     Component Value Date/Time   DIAGPAP  08/14/2022 1357    - Negative for intraepithelial lesion or malignancy (NILM)   HPVHIGH Negative 08/14/2022 1357   ADEQPAP  08/14/2022 1357    Satisfactory for evaluation; transformation zone component ABSENT.    GYN HISTORY:    Component Value Date/Time   DIAGPAP  08/14/2022 1357    - Negative for intraepithelial lesion or malignancy (NILM)   HPVHIGH Negative 08/14/2022 1357   ADEQPAP  08/14/2022 1357    Satisfactory for evaluation;  transformation zone component ABSENT.    OB History  Gravida Para Term Preterm AB Living  5 2   3 2   SAB IAB Ectopic Multiple Live Births          # Outcome Date GA Lbr Len/2nd Weight Sex Type Anes PTL Lv  5 AB           4 AB           3 AB           2 Para           1 Para             Past Medical History:  Diagnosis Date   Anxiety    Atrial fibrillation    Blood transfusion without reported diagnosis    Depression    Hypertension     Past Surgical History:  Procedure Laterality Date   CESAREAN SECTION  05/2009   LIPOSUCTION  03/2021   [place]   mscarrigage      Current Outpatient Medications on File Prior to Visit  Medication Sig Dispense Refill   ALPRAZolam (XANAX) 0.25 MG tablet Take 1 tablet by mouth daily as needed.     AUVELITY 45-105 MG TBCR Take 1 tablet by mouth daily.     DULoxetine (CYMBALTA) 60 MG capsule Take by mouth daily.     hydrochlorothiazide  (HYDRODIURIL ) 25 MG tablet Take 1 tablet (25 mg total) by mouth daily. 90 tablet 1   Iron , Ferrous Sulfate , 325 (65 Fe) MG TABS Take 325 mg by mouth daily. 30 tablet 0   megestrol  (MEGACE ) 40 MG tablet Take 1 tablet (40 mg total) by mouth 4 (four) times daily. 120 tablet 1   megestrol  (MEGACE ) 40 MG tablet Take 1 tablet (40 mg total) by mouth 4 (four) times daily. 120 tablet 1   metoprolol  succinate (TOPROL -XL) 50 MG 24 hr tablet Take 1 tablet (50 mg total) by mouth daily. Take with or immediately following a meal. 90 tablet 1   naproxen  (NAPROSYN ) 500 MG tablet Take 1 tablet (500 mg total) by mouth 2 (two) times daily. 30 tablet 0   ondansetron  (ZOFRAN ) 4 MG tablet Take 1 tablet (4 mg total) by mouth every 6 (six) hours. 12 tablet 0   traZODone (DESYREL) 50 MG tablet Take 1-2 tablets by mouth at bedtime.     No current facility-administered medications on file prior to visit.    Social History   Socioeconomic History   Marital status: Married    Spouse name: Not on file   Number of children: Not on  file   Years of education: Not on file   Highest education level: Not on file  Occupational History   Not on file  Tobacco Use   Smoking status: Never    Passive exposure: Never   Smokeless tobacco: Never  Vaping Use   Vaping status: Never Used  Substance and Sexual Activity   Alcohol use: No   Drug use: No   Sexual activity: Yes    Partners: Male    Birth control/protection: None    Comment: menarche 58yo, sexual debut 58yo  Other Topics Concern   Not on file  Social History Narrative   Not on file   Social Drivers of Health   Financial Resource Strain: Patient Declined (10/24/2022)   Received from [facility]   Overall Financial Resource Strain (CARDIA)    Difficulty of Paying Living Expenses: Patient declined  Food Insecurity: Patient Declined (10/24/2022)   Received from [facility]   Hunger Vital Sign    Worried About Running Out of Food in the Last Year: Patient declined    Ran Out of Food in the Last Year: Patient declined  Transportation Needs: Patient Declined (10/24/2022)   Received from [facility]   PRAPARE - Transportation    Lack of Transportation (Medical): Patient declined    Lack of Transportation (Non-Medical): Patient declined  Physical Activity: Not on file  Stress: Not on file  Social Connections: Unknown (09/28/2022)   Received from [facility]   Social Network    Social Network: Not on file  Intimate Partner Violence: Unknown (09/28/2022)   Received from [facility]   HITS    Physically Hurt: Not on file    Insult or Talk Down To: Not on file    Threaten Physical Harm: Not on file    Scream or Curse: Not on file    Family History  Problem Relation Age of Onset   Diabetes Mother    Hypertension Mother    Anxiety disorder Mother    Miscarriages / Stillbirths Mother    Diabetes Father    Hyperlipidemia Brother    Cerebral palsy Brother    Anxiety disorder Daughter    Depression Daughter    Anxiety disorder Son      No Known  Allergies    Patient's last menstrual period was Patient's last menstrual period was 11/17/2022 (approximate).SABRA           PROCEDURE: EMB Procedure explained and r/b/a/I and importance of the EMB was discussed. Consent obtained for the procedure.  Timeout performed. A bivalve speculum was placed in the vagina.  The cervix was grasped with a single tooth tenaculum.  Pipelle was inserted and rotated.  Uterus sound to 8cm.  Adequate specimen was obtained and sent to pathology.  All instruments were removed.  Patient tolerated the procedure well.  To notify patient of the results.   Review of Systems Alls systems reviewed and are negative.     OBGyn Exam    A:         EMB, menorrhagia, dysmenorrhea, anemia, likely adenomyosis                             P:        EMB collected today.  Encouraged patient to start the iron  treatment   Counseled on all options.  She would like to have the [facility].  Counseled extensively on the procedure including but not limited to what to expect and risks and benefits.  Counseled on postop care and pelvic rest for 10 weeks after the surgery with restricted lifting for 6-8 weeks after.  Counseled on the benefits of the robotic procedure with faster return to daily activities, improved outcomes, and less  risk for complications. She would like to have this scheduled.  Continue megace  until surgery.  Placed urgent request for [facility]. Repeat cbc today. No follow-ups on file.  30 minutes spent on reviewing records, imaging,  and one on one patient time and counseling patient and documentation Dr. Glennon Almarie MARLA Glennon

## 2023-02-09 LAB — CBC
HCT: 26.6 % — ABNORMAL LOW (ref 35.0–45.0)
Hemoglobin: 8.8 g/dL — ABNORMAL LOW (ref 11.7–15.5)
MCH: 29.4 pg (ref 27.0–33.0)
MCHC: 33.1 g/dL (ref 32.0–36.0)
MCV: 89 fL (ref 80.0–100.0)
MPV: 10.4 fL (ref 7.5–12.5)
Platelets: 419 10*3/uL — ABNORMAL HIGH (ref 140–400)
RBC: 2.99 10*6/uL — ABNORMAL LOW (ref 3.80–5.10)
RDW: 13 % (ref 11.0–15.0)
WBC: 10.9 10*3/uL — ABNORMAL HIGH (ref 3.8–10.8)

## 2023-02-09 LAB — HEMOGLOBIN A1C
Hgb A1c MFr Bld: 5.8 %{Hb} — ABNORMAL HIGH (ref <5.7)
Mean Plasma Glucose: 120 mg/dL
eAG (mmol/L): 6.6 mmol/L

## 2023-02-09 LAB — ESTRADIOL: Estradiol: 16 pg/mL

## 2023-02-11 ENCOUNTER — Ambulatory Visit: Payer: 59 | Admitting: Obstetrics and Gynecology

## 2023-02-11 ENCOUNTER — Encounter: Payer: Self-pay | Admitting: Obstetrics and Gynecology

## 2023-02-11 ENCOUNTER — Encounter: Payer: Self-pay | Admitting: *Deleted

## 2023-02-11 NOTE — Telephone Encounter (Signed)
 See lab results.   Patient is scheduled for lab appt 02/12/22.

## 2023-02-12 ENCOUNTER — Other Ambulatory Visit: Payer: Self-pay | Admitting: *Deleted

## 2023-02-12 ENCOUNTER — Other Ambulatory Visit: Payer: 59

## 2023-02-12 ENCOUNTER — Telehealth: Payer: Self-pay | Admitting: *Deleted

## 2023-02-12 DIAGNOSIS — N921 Excessive and frequent menstruation with irregular cycle: Secondary | ICD-10-CM

## 2023-02-12 DIAGNOSIS — D500 Iron deficiency anemia secondary to blood loss (chronic): Secondary | ICD-10-CM

## 2023-02-12 LAB — CBC
HCT: 27.5 % — ABNORMAL LOW (ref 35.0–45.0)
Hemoglobin: 8.8 g/dL — ABNORMAL LOW (ref 11.7–15.5)
MCH: 28.9 pg (ref 27.0–33.0)
MCHC: 32 g/dL (ref 32.0–36.0)
MCV: 90.5 fL (ref 80.0–100.0)
MPV: 9.7 fL (ref 7.5–12.5)
Platelets: 521 10*3/uL — ABNORMAL HIGH (ref 140–400)
RBC: 3.04 10*6/uL — ABNORMAL LOW (ref 3.80–5.10)
RDW: 13.2 % (ref 11.0–15.0)
WBC: 11.6 10*3/uL — ABNORMAL HIGH (ref 3.8–10.8)

## 2023-02-12 LAB — SURGICAL PATHOLOGY

## 2023-02-12 NOTE — Telephone Encounter (Signed)
 Order to Dr. Glennon to review and sign, to be faxed.   Business office notified to determine if PA required.   Call returned to patient. Advised to go to [facility], entrance A, [address], go to admitting, and then admitting will direct her to infusion clinic. Patient verbalizes understanding and is agreeable.

## 2023-02-12 NOTE — Telephone Encounter (Signed)
 Reviewed option of blood pre-op day of surgery with [facility] pre-op nurse, Karna. Was advised unable to receive blood day of in pre-op.   See result dated 02/08/23. Patient is scheduled for STAT CBC today at 11am. Lab cancelled for 02/13/23.   Spoke with Kristin, RN at [facility], opening available 02/13/23 at 8am. Dr. Glennon will need to contact Blood Bank for approval prior to scheduling if hemoglobin is greater than 8. Blood bank number [phone].   Will wait for CBC results dated 02/12/23.   Routing to Dr. Glennon for f/u once results are back.

## 2023-02-12 NOTE — Telephone Encounter (Signed)
 Spoke with Romi about recent blood count and being symptomatic. The r/b/a/I of blood products were discussed with patient. The risk of allergic reaction, HIV(1 in one million), hep B and hep C (1 in 600,000) and risk of cross transfusion reaction were discussed.  Patient agreed and wanted to proceed with getting one unit of prbc's tomorrow AM before surgery. Dr. Glennon

## 2023-02-13 ENCOUNTER — Encounter (HOSPITAL_BASED_OUTPATIENT_CLINIC_OR_DEPARTMENT_OTHER): Payer: Self-pay | Admitting: Obstetrics and Gynecology

## 2023-02-13 ENCOUNTER — Other Ambulatory Visit: Payer: 59

## 2023-02-13 ENCOUNTER — Ambulatory Visit (HOSPITAL_COMMUNITY)
Admission: RE | Admit: 2023-02-13 | Discharge: 2023-02-13 | Disposition: A | Discharge status: Home / Self Care | Payer: 59 | Source: Ambulatory Visit | Attending: Obstetrics and Gynecology | Admitting: Obstetrics and Gynecology

## 2023-02-13 ENCOUNTER — Other Ambulatory Visit (HOSPITAL_COMMUNITY): Payer: Self-pay | Admitting: *Deleted

## 2023-02-13 DIAGNOSIS — D649 Anemia, unspecified: Secondary | ICD-10-CM

## 2023-02-13 LAB — PREPARE RBC (CROSSMATCH)

## 2023-02-13 MED ORDER — SODIUM CHLORIDE 0.9% IV SOLUTION: Freq: Once | INTRAVENOUS | Status: DC

## 2023-02-13 NOTE — Telephone Encounter (Signed)
 Patient paged on-call provider reporting chest pain and shortness of breath.  She noted that this episode happened once overnight during an episode of laughing.  The chest pain is improved and now feels like a spot of tenderness along her rib cage.  When she presses along it it is tender.  She notes intermittent shortness of breath that has been associated with her anemia.  She is scheduled for a blood transfusion today and a hysterectomy this week. Given improvement in symptoms and description of pain, pain sounds to be musculoskeletal along the rib cage.  Patient was instructed to continue to monitor symptoms and follow-up for her iron  infusions today.  Will route message to surgeon performing her hysterectomy for any additional recommendations.

## 2023-02-13 NOTE — Progress Notes (Signed)
 Your procedure is scheduled on :  Friday,  02-15-2023  Report to [facility] [place] AT  _8:00__ AM.   Call this number if you have problems the morning of surgery  [phone]. Any questions prior to surgery call pre-op nurse,  Biran Mayberry :  [phone]   OUR ADDRESS IS [address].  WE ARE LOCATED IN THE [facility]  PLEASE BRING YOUR INSURANCE CARD AND PHOTO ID DAY OF SURGERY.                                     REMEMBER:  Do not eat food after midnight night before surgery.  You may have clear liquid diet from midnight night before surgery until 7:00 AM.  NO clear liquids after 7:00 AM day of surgery.  This includes no water ,  candy,  gum,  and mints.   Please brush your teeth morning of surgery and rinse mouth out.   CLEAR LIQUID DIET Allowed      Water                                                                    Coffee and tea, regular and decaf  (NO cream or milk products of any type, may sweeten, no honey)                         Carbonated beverages, regular and diet                                    Sports drinks like Gatorade _____________________________________________________________________     TAKE ONLY THESE MEDICATIONS MORNING OF SURGERY: Auvelity Metoprolol  (toprol ) Megestrol  (megace )                                       DO NOT WEAR JEWERLY/  METAL/  PIERCINGS (INCLUDING NO PLASTIC PIERCINGS) DO NOT WEAR LOTIONS, POWDERS, PERFUMES OR NAIL POLISH ON YOUR FINGERNAILS. TOENAIL POLISH IS OK TO WEAR. DO NOT SHAVE FOR 48 HOURS PRIOR TO DAY OF SURGERY.  CONTACTS, GLASSES, OR DENTURES MAY NOT BE WORN TO SURGERY.  REMEMBER: NO SMOKING, VAPING ,  DRUGS OR ALCOHOL FOR 24 HOURS BEFORE YOUR SURGERY.                                    [place] IS NOT RESPONSIBLE  FOR ANY BELONGINGS.                                                                    SABRA           [facility] - Preparing for Surgery Before surgery,  you can play an important role.  Because skin is not sterile, your skin needs to be as free of germs as possible.  You can reduce the number of germs on your skin by washing with CHG (chlorahexidine gluconate) soap before surgery.  CHG is an antiseptic cleaner which kills germs and bonds with the skin to continue killing germs even after washing. Please DO NOT use if you have an allergy to CHG or antibacterial soaps.  If your skin becomes reddened/irritated stop using the CHG and inform your nurse when you arrive at Short Stay. Do not shave (including legs and underarms) for at least 48 hours prior to the first CHG shower.  You may shave your face/neck. Please follow these instructions carefully:  1.  Shower with CHG Soap the night before surgery and the  morning of Surgery.  2.  If you choose to wash your hair, wash your hair first as usual with your  normal  shampoo.  3.  After you shampoo, rinse your hair and body thoroughly to remove the  shampoo.                                        4.  Use CHG as you would any other liquid soap.  You can apply chg directly  to the skin and wash , chg soap provided, night before and morning of your surgery.  5.  Apply the CHG Soap to your body ONLY FROM THE NECK DOWN.   Do not use on face/ open                           Wound or open sores. Avoid contact with eyes, ears mouth and genitals (private parts).                       Wash face,  Genitals (private parts) with your normal soap.             6.  Wash thoroughly, paying special attention to the area where your surgery  will be performed.  7.  Thoroughly rinse your body with warm water  from the neck down.  8.  DO NOT shower/wash with your normal soap after using and rinsing off  the CHG Soap.             9.  Pat yourself dry with a clean towel.            10.  Wear clean pajamas.            11.  Place clean sheets on your bed the night of your first shower and do not  sleep with pets. Day of Surgery : Do  not apply any lotions/ powders the morning of surgery.  Please wear clean clothes to the hospital/surgery center.  IF YOU HAVE ANY SKIN IRRITATION OR PROBLEMS WITH THE SURGICAL SOAP, PLEASE GET A BAR OF GOLD DIAL SOAP AND SHOWER THE NIGHT BEFORE YOUR SURGERY AND THE MORNING OF YOUR SURGERY. PLEASE LET THE NURSE KNOW MORNING OF YOUR SURGERY IF YOU HAD ANY PROBLEMS WITH THE SURGICAL SOAP.   YOUR SURGEON MAY HAVE REQUESTED EXTENDED RECOVERY TIME AFTER YOUR SURGERY. IT COULD BE A  JUST A FEW HOURS  UP TO AN OVERNIGHT STAY.  YOUR SURGEON SHOULD HAVE DISCUSSED THIS WITH YOU PRIOR TO YOUR SURGERY. IN  THE EVENT YOU NEED TO STAY OVERNIGHT PLEASE REFER TO THE FOLLOWING GUIDELINES. YOU MAY HAVE UP TO 4 VISITORS  MAY VISIT IN THE EXTENDED RECOVERY ROOM UNTIL 800 PM ONLY.  ONE  VISITOR AGE 23 AND OVER MAY SPEND THE NIGHT AND MUST BE IN EXTENDED RECOVERY ROOM NO LATER THAN 800 PM . YOUR DISCHARGE TIME AFTER YOU SPEND THE NIGHT IS 900 AM THE MORNING AFTER YOUR SURGERY. YOU MAY PACK A SMALL OVERNIGHT BAG WITH TOILETRIES FOR YOUR OVERNIGHT STAY IF YOU WISH.  REGARDLESS OF IF YOU STAY OVER NIGHT OR ARE DISCHARGED THE SAME DAY YOU WILL BE REQUIRED TO HAVE A RESPONSIBLE ADULT (18 YRS OLD OR OLDER) STAY WITH YOU FOR AT LEAST THE FIRST 88 HOURS WHEN HOME.  YOUR PRESCRIPTION MEDICATIONS WILL BE PROVIDED DURING [facility] STAY.  ________________________________________________________________________

## 2023-02-13 NOTE — Progress Notes (Signed)
 Spoke w/ via phone for pre-op interview--- pt Lab needs dos----  urine preg, cbc, bmp, t&s, ekg       Lab results------ pt had transfusion on 02-13-2023 [facility]  COVID test -----patient states asymptomatic no test needed Arrive at ------- 0800 on 02-15-2023 NPO after MN NO Solid Food.  Clear liquids from MN until--- 0700 Med rec completed Medications to take morning of surgery ----- auvelity, metoprolol , megetrol Diabetic medication ----- n/a Patient instructed no nail polish to be worn day of surgery Patient instructed to bring photo id and insurance card day of surgery Patient aware to have Driver (ride ) / caregiver    for 24 hours after surgery - husband, paul Patient Special Instructions ----- pt is pickijng up bag from front desk @[facility]  on 02-14-2023 between 0830 -- 1200 with hibiclens and written instructions.  To call with any questions. Pre-Op special Instructions -----  pt was just added on late afternoon Friday 02-08-2023 with low Hg 8.8 , waited for surgeon recommendation.  On Tuesday 02-12-2023 CBC repeat stat Hg 8.8 and patient scheduled for transfusion on 01-08-82025 .  Patient has had multiple sticks in past week and since having to do lab day of surgery will have bmp and EKG done in pre-op. Patient verbalized understanding of instructions that were given at this phone interview. Patient denies chest pain, sob, fever, cough at the interview.

## 2023-02-14 ENCOUNTER — Other Ambulatory Visit: Payer: 59 | Admitting: Obstetrics and Gynecology

## 2023-02-14 ENCOUNTER — Other Ambulatory Visit: Payer: 59

## 2023-02-14 LAB — TYPE AND SCREEN
ABO/RH(D): A NEG
Antibody Screen: NEGATIVE
Unit division: 0

## 2023-02-14 LAB — BPAM RBC
Blood Product Expiration Date: 202501232359
ISSUE DATE / TIME: 202501080955
Unit Type and Rh: 600

## 2023-02-15 ENCOUNTER — Ambulatory Visit (HOSPITAL_BASED_OUTPATIENT_CLINIC_OR_DEPARTMENT_OTHER)
Admission: RE | Admit: 2023-02-15 | Discharge: 2023-02-15 | Disposition: A | Discharge status: Home / Self Care | Payer: 59 | Attending: Obstetrics and Gynecology | Admitting: Obstetrics and Gynecology

## 2023-02-15 ENCOUNTER — Encounter (HOSPITAL_BASED_OUTPATIENT_CLINIC_OR_DEPARTMENT_OTHER): Payer: Self-pay | Admitting: Obstetrics and Gynecology

## 2023-02-15 ENCOUNTER — Ambulatory Visit (HOSPITAL_BASED_OUTPATIENT_CLINIC_OR_DEPARTMENT_OTHER): Payer: 59 | Admitting: Anesthesiology

## 2023-02-15 ENCOUNTER — Encounter (HOSPITAL_BASED_OUTPATIENT_CLINIC_OR_DEPARTMENT_OTHER)
Admission: RE | Disposition: A | Discharge status: Home / Self Care | Payer: Self-pay | Source: Home / Self Care | Attending: Obstetrics and Gynecology

## 2023-02-15 ENCOUNTER — Other Ambulatory Visit: Payer: Self-pay

## 2023-02-15 DIAGNOSIS — I10 Essential (primary) hypertension: Secondary | ICD-10-CM | POA: Diagnosis not present

## 2023-02-15 DIAGNOSIS — N920 Excessive and frequent menstruation with regular cycle: Secondary | ICD-10-CM | POA: Diagnosis present

## 2023-02-15 DIAGNOSIS — N921 Excessive and frequent menstruation with irregular cycle: Secondary | ICD-10-CM | POA: Insufficient documentation

## 2023-02-15 DIAGNOSIS — K219 Gastro-esophageal reflux disease without esophagitis: Secondary | ICD-10-CM | POA: Diagnosis not present

## 2023-02-15 DIAGNOSIS — F418 Other specified anxiety disorders: Secondary | ICD-10-CM

## 2023-02-15 DIAGNOSIS — D500 Iron deficiency anemia secondary to blood loss (chronic): Secondary | ICD-10-CM | POA: Insufficient documentation

## 2023-02-15 DIAGNOSIS — F419 Anxiety disorder, unspecified: Secondary | ICD-10-CM | POA: Insufficient documentation

## 2023-02-15 DIAGNOSIS — F32A Depression, unspecified: Secondary | ICD-10-CM | POA: Diagnosis not present

## 2023-02-15 DIAGNOSIS — N8003 Adenomyosis of the uterus: Secondary | ICD-10-CM

## 2023-02-15 DIAGNOSIS — D259 Leiomyoma of uterus, unspecified: Secondary | ICD-10-CM | POA: Insufficient documentation

## 2023-02-15 DIAGNOSIS — N888 Other specified noninflammatory disorders of cervix uteri: Secondary | ICD-10-CM | POA: Diagnosis not present

## 2023-02-15 DIAGNOSIS — N946 Dysmenorrhea, unspecified: Secondary | ICD-10-CM | POA: Insufficient documentation

## 2023-02-15 DIAGNOSIS — Z01818 Encounter for other preprocedural examination: Secondary | ICD-10-CM

## 2023-02-15 HISTORY — DX: Dysmenorrhea, unspecified: N94.6

## 2023-02-15 HISTORY — DX: Major depressive disorder, single episode, unspecified: F32.9

## 2023-02-15 HISTORY — DX: Excessive and frequent menstruation with irregular cycle: N92.1

## 2023-02-15 HISTORY — DX: Gastro-esophageal reflux disease without esophagitis: K21.9

## 2023-02-15 HISTORY — DX: Clonic hemifacial spasm, unspecified: G51.39

## 2023-02-15 HISTORY — PX: CYSTOSCOPY: SHX5120

## 2023-02-15 HISTORY — DX: Anemia, unspecified: D64.9

## 2023-02-15 HISTORY — PX: ROBOTIC ASSISTED LAPAROSCOPIC HYSTERECTOMY AND SALPINGECTOMY: SHX6379

## 2023-02-15 HISTORY — DX: Adenomyosis of the uterus: N80.03

## 2023-02-15 HISTORY — PX: ABDOMINAL HYSTERECTOMY: SHX81

## 2023-02-15 HISTORY — DX: Iron deficiency anemia secondary to blood loss (chronic): D50.0

## 2023-02-15 HISTORY — DX: Presence of spectacles and contact lenses: Z97.3

## 2023-02-15 HISTORY — DX: Generalized anxiety disorder: F41.1

## 2023-02-15 LAB — CBC
HCT: 32.9 % — ABNORMAL LOW (ref 36.0–46.0)
Hemoglobin: 10.6 g/dL — ABNORMAL LOW (ref 12.0–15.0)
MCH: 29.9 pg (ref 26.0–34.0)
MCHC: 32.2 g/dL (ref 30.0–36.0)
MCV: 92.7 fL (ref 80.0–100.0)
Platelets: 507 10*3/uL — ABNORMAL HIGH (ref 150–400)
RBC: 3.55 MIL/uL — ABNORMAL LOW (ref 3.87–5.11)
RDW: 16.4 % — ABNORMAL HIGH (ref 11.5–15.5)
WBC: 11.4 10*3/uL — ABNORMAL HIGH (ref 4.0–10.5)
nRBC: 0.2 % (ref 0.0–0.2)

## 2023-02-15 LAB — PREPARE RBC (CROSSMATCH)

## 2023-02-15 LAB — POCT PREGNANCY, URINE: Preg Test, Ur: NEGATIVE

## 2023-02-15 SURGERY — XI ROBOTIC ASSISTED LAPAROSCOPIC HYSTERECTOMY AND SALPINGECTOMY
Anesthesia: General | Site: Pelvis

## 2023-02-15 MED ORDER — SODIUM CHLORIDE 0.9 % IV SOLN: Freq: Once | INTRAVENOUS | Status: DC

## 2023-02-15 MED ORDER — DROPERIDOL 2.5 MG/ML IJ SOLN: 0.6250 mg | Freq: Once | INTRAMUSCULAR | Status: DC | PRN

## 2023-02-15 MED ORDER — OXYCODONE HCL 5 MG/5ML PO SOLN: 5.0000 mg | Freq: Once | ORAL | Status: DC | PRN

## 2023-02-15 MED ORDER — ALBUTEROL SULFATE HFA 108 (90 BASE) MCG/ACT IN AERS
INHALATION_SPRAY | RESPIRATORY_TRACT | Status: AC
  Filled 2023-02-15: qty 6.7

## 2023-02-15 MED ORDER — STERILE WATER FOR IRRIGATION IR SOLN
Status: DC | PRN
  Administered 2023-02-15: 1000 mL

## 2023-02-15 MED ORDER — SODIUM CHLORIDE 0.9 % IV SOLN
INTRAVENOUS | Status: AC
  Filled 2023-02-15: qty 100

## 2023-02-15 MED ORDER — METOCLOPRAMIDE HCL 10 MG PO TABS: 10.0000 mg | ORAL_TABLET | Freq: Three times a day (TID) | ORAL | 0 refills | Status: DC | PRN

## 2023-02-15 MED ORDER — SODIUM CHLORIDE 0.9 % IV SOLN
2.0000 g | INTRAVENOUS | Status: AC
  Administered 2023-02-15: 2 g via INTRAVENOUS

## 2023-02-15 MED ORDER — IBUPROFEN 200 MG PO TABS: 200.0000 mg | ORAL_TABLET | Freq: Four times a day (QID) | ORAL | 0 refills | Status: DC | PRN

## 2023-02-15 MED ORDER — OXYCODONE HCL 5 MG PO TABS: 5.0000 mg | ORAL_TABLET | Freq: Once | ORAL | Status: DC | PRN

## 2023-02-15 MED ORDER — MIDAZOLAM HCL 5 MG/5ML IJ SOLN
INTRAMUSCULAR | Status: DC | PRN
  Administered 2023-02-15: 2 mg via INTRAVENOUS

## 2023-02-15 MED ORDER — LIDOCAINE 2% (20 MG/ML) 5 ML SYRINGE
INTRAMUSCULAR | Status: DC | PRN
  Administered 2023-02-15: 40 mg via INTRAVENOUS

## 2023-02-15 MED ORDER — ROCURONIUM BROMIDE 10 MG/ML (PF) SYRINGE
PREFILLED_SYRINGE | INTRAVENOUS | Status: DC | PRN
  Administered 2023-02-15: 20 mg via INTRAVENOUS
  Administered 2023-02-15: 50 mg via INTRAVENOUS

## 2023-02-15 MED ORDER — ALBUTEROL SULFATE HFA 108 (90 BASE) MCG/ACT IN AERS
INHALATION_SPRAY | RESPIRATORY_TRACT | Status: DC | PRN
  Administered 2023-02-15: 2 via RESPIRATORY_TRACT

## 2023-02-15 MED ORDER — ACETAMINOPHEN 160 MG/5ML PO SOLN: 325.0000 mg | ORAL | Status: DC | PRN

## 2023-02-15 MED ORDER — ALBUMIN HUMAN 5 % IV SOLN
INTRAVENOUS | Status: AC
  Filled 2023-02-15: qty 250

## 2023-02-15 MED ORDER — SCOPOLAMINE 1 MG/3DAYS TD PT72
MEDICATED_PATCH | TRANSDERMAL | Status: AC
  Filled 2023-02-15: qty 1

## 2023-02-15 MED ORDER — LIDOCAINE HCL (PF) 2 % IJ SOLN
INTRAMUSCULAR | Status: AC
  Filled 2023-02-15: qty 5

## 2023-02-15 MED ORDER — ROCURONIUM BROMIDE 10 MG/ML (PF) SYRINGE
PREFILLED_SYRINGE | INTRAVENOUS | Status: AC
  Filled 2023-02-15: qty 10

## 2023-02-15 MED ORDER — POVIDONE-IODINE 10 % EX SWAB
2.0000 | Freq: Once | CUTANEOUS | Status: AC
  Administered 2023-02-15: 2 via TOPICAL

## 2023-02-15 MED ORDER — DEXMEDETOMIDINE HCL IN NACL 80 MCG/20ML IV SOLN
INTRAVENOUS | Status: DC | PRN
  Administered 2023-02-15: 12 ug via INTRAVENOUS

## 2023-02-15 MED ORDER — FENTANYL CITRATE (PF) 100 MCG/2ML IJ SOLN
INTRAMUSCULAR | Status: DC | PRN
  Administered 2023-02-15: 50 ug via INTRAVENOUS

## 2023-02-15 MED ORDER — FENTANYL CITRATE (PF) 100 MCG/2ML IJ SOLN
INTRAMUSCULAR | Status: AC
  Filled 2023-02-15: qty 2

## 2023-02-15 MED ORDER — GABAPENTIN 300 MG PO CAPS
300.0000 mg | ORAL_CAPSULE | ORAL | Status: AC
  Administered 2023-02-15: 300 mg via ORAL

## 2023-02-15 MED ORDER — GABAPENTIN 300 MG PO CAPS
ORAL_CAPSULE | ORAL | Status: AC
  Filled 2023-02-15: qty 1

## 2023-02-15 MED ORDER — LACTATED RINGERS IV SOLN: INTRAVENOUS | Status: DC

## 2023-02-15 MED ORDER — ACETAMINOPHEN 325 MG PO TABS
ORAL_TABLET | ORAL | Status: AC
  Filled 2023-02-15: qty 2

## 2023-02-15 MED ORDER — PROPOFOL 10 MG/ML IV BOLUS
INTRAVENOUS | Status: DC | PRN
  Administered 2023-02-15: 130 mg via INTRAVENOUS

## 2023-02-15 MED ORDER — SUGAMMADEX SODIUM 200 MG/2ML IV SOLN
INTRAVENOUS | Status: DC | PRN
  Administered 2023-02-15: 280 mg via INTRAVENOUS

## 2023-02-15 MED ORDER — ONDANSETRON HCL 4 MG/2ML IJ SOLN
INTRAMUSCULAR | Status: AC
  Filled 2023-02-15: qty 2

## 2023-02-15 MED ORDER — PHENYLEPHRINE 80 MCG/ML (10ML) SYRINGE FOR IV PUSH (FOR BLOOD PRESSURE SUPPORT)
PREFILLED_SYRINGE | INTRAVENOUS | Status: DC | PRN
  Administered 2023-02-15 (×3): 120 ug via INTRAVENOUS

## 2023-02-15 MED ORDER — ACETAMINOPHEN 500 MG PO TABS
1000.0000 mg | ORAL_TABLET | ORAL | Status: AC
  Administered 2023-02-15: 1000 mg via ORAL

## 2023-02-15 MED ORDER — ACETAMINOPHEN 500 MG PO TABS
ORAL_TABLET | ORAL | Status: AC
  Filled 2023-02-15: qty 2

## 2023-02-15 MED ORDER — CEFOXITIN SODIUM 2 G IV SOLR
INTRAVENOUS | Status: AC
  Filled 2023-02-15: qty 2

## 2023-02-15 MED ORDER — ONDANSETRON HCL 4 MG/2ML IJ SOLN
INTRAMUSCULAR | Status: DC | PRN
  Administered 2023-02-15: 4 mg via INTRAVENOUS

## 2023-02-15 MED ORDER — DEXAMETHASONE SODIUM PHOSPHATE 10 MG/ML IJ SOLN
INTRAMUSCULAR | Status: DC | PRN
  Administered 2023-02-15: 5 mg via INTRAVENOUS

## 2023-02-15 MED ORDER — DEXAMETHASONE SODIUM PHOSPHATE 10 MG/ML IJ SOLN
INTRAMUSCULAR | Status: AC
  Filled 2023-02-15: qty 1

## 2023-02-15 MED ORDER — ALBUMIN HUMAN 5 % IV SOLN: INTRAVENOUS | Status: DC | PRN

## 2023-02-15 MED ORDER — CEFAZOLIN SODIUM 1 G IJ SOLR
Freq: Once | INTRAMUSCULAR | Status: DC
  Filled 2023-02-15 (×2): qty 10

## 2023-02-15 MED ORDER — MIDAZOLAM HCL 2 MG/2ML IJ SOLN
INTRAMUSCULAR | Status: AC
  Filled 2023-02-15: qty 2

## 2023-02-15 MED ORDER — ACETAMINOPHEN 10 MG/ML IV SOLN: 1000.0000 mg | Freq: Once | INTRAVENOUS | Status: DC | PRN

## 2023-02-15 MED ORDER — FENTANYL CITRATE (PF) 250 MCG/5ML IJ SOLN
INTRAMUSCULAR | Status: AC
  Filled 2023-02-15: qty 5

## 2023-02-15 MED ORDER — HEMOSTATIC AGENTS (NO CHARGE) OPTIME
TOPICAL | Status: DC | PRN
  Administered 2023-02-15: 1

## 2023-02-15 MED ORDER — OXYCODONE HCL 5 MG PO TABS: 5.0000 mg | ORAL_TABLET | ORAL | 0 refills | Status: DC | PRN

## 2023-02-15 MED ORDER — BUPIVACAINE HCL (PF) 0.5 % IJ SOLN
INTRAMUSCULAR | Status: DC | PRN
  Administered 2023-02-15: 30 mL

## 2023-02-15 MED ORDER — ACETAMINOPHEN 325 MG PO TABS: 325.0000 mg | ORAL_TABLET | ORAL | Status: DC | PRN

## 2023-02-15 MED ORDER — FENTANYL CITRATE (PF) 100 MCG/2ML IJ SOLN
25.0000 ug | INTRAMUSCULAR | Status: DC | PRN
  Administered 2023-02-15 (×2): 25 ug via INTRAVENOUS

## 2023-02-15 MED ORDER — SCOPOLAMINE 1 MG/3DAYS TD PT72
1.0000 | MEDICATED_PATCH | TRANSDERMAL | Status: DC
  Administered 2023-02-15: 1.5 mg via TRANSDERMAL

## 2023-02-15 MED ORDER — LACTATED RINGERS IV SOLN: INTRAVENOUS | Status: AC

## 2023-02-15 MED ORDER — SODIUM CHLORIDE 0.9 % IV SOLN
INTRAVENOUS | Status: DC | PRN
  Administered 2023-02-15: 1000 mL

## 2023-02-15 MED ORDER — SODIUM CHLORIDE 0.9% IV SOLUTION: Freq: Once | INTRAVENOUS | Status: DC

## 2023-02-15 SURGICAL SUPPLY — 63 items
APPLICATOR ARISTA FLEXITIP XL (MISCELLANEOUS) IMPLANT
BARRIER ADHS 3X4 INTERCEED (GAUZE/BANDAGES/DRESSINGS) IMPLANT
CATH FOLEY 3WAY 5CC 16FR (CATHETERS) ×2 IMPLANT
COVER BACK TABLE 60X90IN (DRAPES) ×2 IMPLANT
COVER TIP SHEARS 8 DVNC (MISCELLANEOUS) ×2 IMPLANT
DEFOGGER SCOPE WARMER CLEARIFY (MISCELLANEOUS) ×2 IMPLANT
DERMABOND ADVANCED .7 DNX12 (GAUZE/BANDAGES/DRESSINGS) ×2 IMPLANT
DRAPE ARM DVNC X/XI (DISPOSABLE) ×8 IMPLANT
DRAPE COLUMN DVNC XI (DISPOSABLE) ×2 IMPLANT
DRAPE UTILITY XL STRL (DRAPES) ×2 IMPLANT
DRIVER NDL MEGA SUTCUT DVNCXI (INSTRUMENTS) IMPLANT
DRIVER NDLE MEGA SUTCUT DVNCXI (INSTRUMENTS) ×2 IMPLANT
DURAPREP 26ML APPLICATOR (WOUND CARE) ×2 IMPLANT
ELECT REM PT RETURN 9FT ADLT (ELECTROSURGICAL) ×2 IMPLANT
ELECTRODE REM PT RTRN 9FT ADLT (ELECTROSURGICAL) ×2 IMPLANT
FORCEPS PROGRASP DVNC XI (FORCEP) IMPLANT
GAUZE 4X4 16PLY ~~LOC~~+RFID DBL (SPONGE) IMPLANT
GLOVE BIO SURGEON STRL SZ7 (GLOVE) IMPLANT
GLOVE BIOGEL PI IND STRL 6.5 (GLOVE) IMPLANT
GLOVE NEODERM STER SZ 7 (GLOVE) ×6 IMPLANT
GLOVE SURG SS PI 7.0 STRL IVOR (GLOVE) IMPLANT
GLOVE SURG SS PI 7.5 STRL IVOR (GLOVE) IMPLANT
GYRUS RUMI II 2.5CM BLUE (DISPOSABLE) IMPLANT
GYRUS RUMI II 3.5CM BLUE (DISPOSABLE) IMPLANT
GYRUS RUMI II 4.0CM BLUE (DISPOSABLE) IMPLANT
HEMOSTAT ARISTA ABSORB 3G PWDR (HEMOSTASIS) IMPLANT
HIBICLENS CHG 4% 4OZ BTL (MISCELLANEOUS) ×4 IMPLANT
HOLDER FOLEY CATH W/STRAP (MISCELLANEOUS) IMPLANT
IRRIG SUCT STRYKERFLOW 2 WTIP (MISCELLANEOUS) ×2 IMPLANT
IRRIGATION SUCT STRKRFLW 2 WTP (MISCELLANEOUS) ×2 IMPLANT
KIT PINK PAD W/HEAD ARE REST (MISCELLANEOUS) ×2 IMPLANT
KIT PINK PAD W/HEAD ARM REST (MISCELLANEOUS) ×2 IMPLANT
KIT TURNOVER CYSTO (KITS) ×2 IMPLANT
LEGGING LITHOTOMY PAIR STRL (DRAPES) ×2 IMPLANT
MANIFOLD NEPTUNE II (INSTRUMENTS) ×2 IMPLANT
OBTURATOR OPTICAL STND 8 DVNC (TROCAR) ×2 IMPLANT
OBTURATOR OPTICALSTD 8 DVNC (TROCAR) ×2 IMPLANT
PACK ROBOT WH (CUSTOM PROCEDURE TRAY) ×2 IMPLANT
PACK ROBOTIC GOWN (GOWN DISPOSABLE) ×2 IMPLANT
PAD OB MATERNITY 4.3X12.25 (PERSONAL CARE ITEMS) ×2 IMPLANT
RUMI II 3.0CM BLUE KOH-EFFICIE (DISPOSABLE) IMPLANT
RUMI II GYRUS 2.5CM BLUE (DISPOSABLE) IMPLANT
RUMI II GYRUS 3.5CM BLUE (DISPOSABLE) IMPLANT
RUMI II GYRUS 4.0CM BLUE (DISPOSABLE) IMPLANT
SCISSORS MNPLR CVD DVNC XI (INSTRUMENTS) IMPLANT
SCOPETTES 8 STERILE (MISCELLANEOUS) IMPLANT
SEAL UNIV 5-12 XI (MISCELLANEOUS) ×6 IMPLANT
SEALER VESSEL EXT DVNC XI (MISCELLANEOUS) IMPLANT
SET IRRIG Y TYPE TUR BLADDER L (SET/KITS/TRAYS/PACK) ×2 IMPLANT
SET TUBE SMOKE EVAC HIGH FLOW (TUBING) ×2 IMPLANT
SOL PREP POV-IOD 4OZ 10% (MISCELLANEOUS) ×2 IMPLANT
SPIKE FLUID TRANSFER (MISCELLANEOUS) ×2 IMPLANT
SUT MNCRL AB 4-0 PS2 18 (SUTURE) ×2 IMPLANT
SUT VIC AB 3-0 SH 27X BRD (SUTURE) IMPLANT
SUT VLOC 180 0 9IN GS21 (SUTURE) ×2 IMPLANT
TIP RUMI ORANGE 6.7MMX12CM (TIP) IMPLANT
TIP UTERINE 5.1X6CM LAV DISP (MISCELLANEOUS) IMPLANT
TIP UTERINE 6.7X10CM GRN DISP (MISCELLANEOUS) IMPLANT
TIP UTERINE 6.7X6CM WHT DISP (MISCELLANEOUS) IMPLANT
TIP UTERINE 6.7X8CM BLUE DISP (MISCELLANEOUS) IMPLANT
TOWEL OR 17X24 6PK STRL BLUE (TOWEL DISPOSABLE) ×2 IMPLANT
UNDERPAD 30X36 HEAVY ABSORB (UNDERPADS AND DIAPERS) ×2 IMPLANT
WATER STERILE IRR 500ML POUR (IV SOLUTION) ×2 IMPLANT

## 2023-02-15 NOTE — Op Note (Signed)
 02/15/2023  [id number] Aimee Maldonado        OPERATIVE REPORT   Preop Diagnosis: menorrhagia, dysmenorrhea, anemia, fibroids, s/p recent blood transfusion for symptomatic anemia, h/o cesarean with T incision on the uterus  Procedure: robotic hysterectomy, bilateral salpingectomy, cystoscopy, LOA   Surgeon: Dr. Almarie Sauer Luismario Coston Assistant: Judyann Rattler, RN   Fluids: please see anesthesia report   Complications: None Anesthesia: General     Findings:  boggy contour 8cm uterus, normal ovaries and tubes Cystoscopy at the end of the case with normal bladder and patent ureters bilaterally.   Estimated blood loss: Minimal <30cc   Specimens: Uterus, cervix and bilateral tubes   Disposition of specimen: Pathology           Patient is taken to the operating room. She is placed in the supine position. She is a running IV in place. Informed consent was present on the chart. SCDs on her lower extremities and functioning properly. Patient was positioned while she was awake.  Her legs were placed in the low lithotomy position in [place] stirrups. Her arms were tucked by the side.  General endotracheal anesthesia was administered by the anesthesia staff without difficulty.       Dura prep was then used to prep the abdomen and Hibiclens was used to prep the inner thighs, perineum and vagina. Once 3 minutes had past the patient was draped in a normal standard fashion. A proper time out was performed and everyone agreed.  The legs were lifted to the high lithotomy position. A bivalve speculum was inserted into the vagina and the anterior lip of the cervix was grasped with single-tooth tenaculum.  The uterus sounded to 8 cm. Pratt dilators were used to dilate the cervix.  The RUMI uterine manipulator was obtained inserted into the endometrial cavity and the bulb of the disposable tip was inflated with 8 cc of normal saline. There was a good fit of the KOH ring around the cervix. The  tenaculum and bivavle speculum was removed. There is also good manipulation of the uterus.  A Foley catheter was placed to straight drain.  Clear urine was noted. Legs were lowered to the low lithotomy position and attention was turned the abdomen.   Superior to the umbilicus, marcaine  0.25% used to anesthetize the skin.  Using #11 blade, 8mm skin incision was made.  The 8mm robotic trocar and sleeve was inserted under direct visualization.  CO2 gas was  started and patient was placed in trendelenburg position.  Two additional 8mm ports were placed under direct visualization in the left and right lower quadrant.     Ureters were identifies.  Attention was turned to the adhesion from anterior abdominal wall to the fundus.  Methelyne blue was give and close attention to the bladder was noted during dissection of this scar tissue.  Attention was then turned to the left side. The left tube was elevated and the mesosalpinx was desiccated with the vessel sealer.  The left uterine ovarian pedicle was serially clamped cauterized and incised. Left round ligament was serially clamped cauterized and incised. The anterior and posterior peritoneum of the inferior leaf of the broad ligament were opened. The beginning of the bladder flap was created.  The bladder was taken down below the level of the KOH ring. The left uterine artery skeletonized and then just superior to the KOH ring this vessel was serially clamped, cauterized, and incised.   Attention was turned the right side.  The uterus was placed on  stretch to the opposite side.    The mesosalpinx was incised freeing the tube. Then the right uterine ovarian pedicle was serially clamped cauterized and incised. Next the right round ligament was serially clamped cauterized and incised. The anterior posterior peritoneum of the inferiorly for the broad ligament were opened. The anterior peritoneum was carried across to the dissection on the left side. The remainder of the  bladder flap was created using sharp dissection. The bladder was well below the level of the KOH ring. The right uterine artery skeletonized. Then the right uterine artery, above the level of the KOH ring, was serially clamped cauterized and incised. The uterus was devascularized at this point.   The colpotomy was performed.  This was carried around a circumferential fashion until the vaginal mucosa was completely incised in the specimen was freed.  The specimen was then delivered to the vagina intact.  A vaginal occlusive device was used to maintain the pneumoperitoneum   Instruments were changed with a needle driver and prograsp.  Using a 9 inch  zero V-lock suture, the cuff was closed by incorporating the anterior and posterior vaginal mucosa in each stitch. This was carried across all the way to the left corner and a running fashion. Two stitches were brought back towards the midline and the suture was cut flush with the vagina. The needle was brought out the pelvis. The pelvis was irrigated. All pedicles were inspected. No bleeding was noted.   Co2 pressures were lowered to 8mm Hg.  Again, no bleeding was noted.  Ureters were noted deep in the pelvis to be peristalsing.  At this point the procedure was completed.  The remaining instruments were removed.  The ports were removed under direct visualization of the laparoscope and the pneumoperitoneum was relieved.   The skin was then closed with subcuticular stitches of 3-0 Vicryl. The skin was cleansed Dermabond was applied. Attention was then turned the vagina and the cuff was inspected. No bleeding was noted.  The Foley catheter was removed.  Cystoscopy was performed.  No sutures or bladder injuries were noted.  Ureters were noted with normal urine jets from each one was seen.  Foley was left out after the cystoscopic fluid was drained and cystoscope removed.  Sponge, lap, needle, instrument counts were correct x2. Patient tolerated the procedure very  well. She was awakened from anesthesia, extubated and taken to recovery in stable condition.      Dr. Glennon

## 2023-02-15 NOTE — Transfer of Care (Signed)
 Immediate Anesthesia Transfer of Care Note  Patient: Aimee Maldonado  Procedure(s) Performed: XI ROBOTIC ASSISTED LAPAROSCOPIC HYSTERECTOMY AND SALPINGECTOMY (Bilateral: Pelvis) CYSTOSCOPY  Patient Location: PACU  Anesthesia Type:General  Level of Consciousness: sedated  Airway & Oxygen Therapy: Patient Spontanous Breathing and Patient connected to nasal cannula oxygen  Post-op Assessment: Report given to RN  Post vital signs: Reviewed and stable  Last Vitals:  Vitals Value Taken Time  BP 145/54 02/15/23 1355  Temp    Pulse 72 02/15/23 1357  Resp 19 02/15/23 1357  SpO2 94 % 02/15/23 1357  Vitals shown include unfiled device data.  Last Pain:  Vitals:   02/15/23 1001  TempSrc: Oral  PainSc: 0-No pain      Patients Stated Pain Goal: 3 (02/15/23 1001)  Complications: No notable events documented.

## 2023-02-15 NOTE — Anesthesia Preprocedure Evaluation (Addendum)
 Anesthesia Evaluation  Patient identified by MRN, date of birth, ID band Patient awake    Reviewed: Allergy & Precautions, NPO status , Patient's Chart, lab work & pertinent test results  Airway Mallampati: II  TM Distance: <3 FB Neck ROM: Full    Dental  (+) Teeth Intact, Dental Advisory Given   Pulmonary neg pulmonary ROS   breath sounds clear to auscultation       Cardiovascular hypertension,  Rhythm:Regular Rate:Normal     Neuro/Psych  PSYCHIATRIC DISORDERS Anxiety Depression    negative neurological ROS     GI/Hepatic Neg liver ROS,GERD  ,,  Endo/Other  negative endocrine ROS    Renal/GU negative Renal ROS     Musculoskeletal negative musculoskeletal ROS (+)    Abdominal   Peds  Hematology  (+) Blood dyscrasia, anemia   Anesthesia Other Findings   Reproductive/Obstetrics                             Anesthesia Physical Anesthesia Plan  ASA: 3  Anesthesia Plan: General   Post-op Pain Management: Tylenol  PO (pre-op)*, Toradol  IV (intra-op)* and Gabapentin  PO (pre-op)*   Induction: Intravenous  PONV Risk Score and Plan: 4 or greater and Ondansetron , Dexamethasone , Midazolam  and Scopolamine  patch - Pre-op  Airway Management Planned: Oral ETT  Additional Equipment: None  Intra-op Plan:   Post-operative Plan: Extubation in OR  Informed Consent: I have reviewed the patients History and Physical, chart, labs and discussed the procedure including the risks, benefits and alternatives for the proposed anesthesia with the patient or authorized representative who has indicated his/her understanding and acceptance.     Dental advisory given  Plan Discussed with: CRNA  Anesthesia Plan Comments:        Anesthesia Quick Evaluation

## 2023-02-15 NOTE — Anesthesia Procedure Notes (Signed)
 Procedure Name: Intubation Date/Time: 02/15/2023 12:05 PM  Performed by: Alejo Delon DASEN, CRNAPre-anesthesia Checklist: Patient identified, Emergency Drugs available, Suction available and Patient being monitored Patient Re-evaluated:Patient Re-evaluated prior to induction Oxygen Delivery Method: Circle system utilized Preoxygenation: Pre-oxygenation with 100% oxygen Induction Type: IV induction Ventilation: Mask ventilation without difficulty Laryngoscope Size: Mac and 3 Grade View: Grade II Tube type: Oral Tube size: 7.0 mm Number of attempts: 1 Airway Equipment and Method: Stylet Placement Confirmation: ETT inserted through vocal cords under direct vision, positive ETCO2 and breath sounds checked- equal and bilateral Secured at: 19 cm Tube secured with: Tape Dental Injury: Teeth and Oropharynx as per pre-operative assessment

## 2023-02-15 NOTE — Interval H&P Note (Signed)
 History and Physical Interval Note:  02/15/2023 11:10 AM  Aimee Maldonado  has presented today for surgery, with the diagnosis of Menorrhagia with irregular cycle, Adenomyosis Iron  deficiency anemia due to chronic blood loss, Dysmenorrhea.  The various methods of treatment have been discussed with the patient and family. After consideration of risks, benefits and other options for treatment, the patient has consented to  Procedure(s): XI ROBOTIC ASSISTED LAPAROSCOPIC HYSTERECTOMY AND SALPINGECTOMY (Bilateral) CYSTOSCOPY (N/A) as a surgical intervention.  The patient's history has been reviewed, patient examined, no change in status, stable for surgery.  I have reviewed the patient's chart and labs.  Questions were answered to the patient's satisfaction.     [name]

## 2023-02-15 NOTE — Discharge Instructions (Signed)
 No acetaminophen /Tylenol  until after 4 pm today if needed.  Post Anesthesia Home Care Instructions  Activity: Get plenty of rest for the remainder of the day. A responsible individual must stay with you for 24 hours following the procedure.  For the next 24 hours, DO NOT: -Drive a car -Advertising copywriter -Drink alcoholic beverages -Take any medication unless instructed by your physician -Make any legal decisions or sign important papers.  Meals: Start with liquid foods such as gelatin or soup. Progress to regular foods as tolerated. Avoid greasy, spicy, heavy foods. If nausea and/or vomiting occur, drink only clear liquids until the nausea and/or vomiting subsides. Call your physician if vomiting continues.  Special Instructions/Symptoms: Your throat may feel dry or sore from the anesthesia or the breathing tube placed in your throat during surgery. If this causes discomfort, gargle with warm salt water . The discomfort should disappear within 24 hours.  If you had a scopolamine  patch placed behind your ear for the management of post- operative nausea and/or vomiting:  1. The medication in the patch is effective for 72 hours, after which it should be removed.  Wrap patch in a tissue and discard in the trash. Wash hands thoroughly with soap and water . 2. You may remove the patch earlier than 72 hours if you experience unpleasant side effects which may include dry mouth, dizziness or visual disturbances. 3. Avoid touching the patch. Wash your hands with soap and water  after contact with the patch.

## 2023-02-18 ENCOUNTER — Encounter (HOSPITAL_BASED_OUTPATIENT_CLINIC_OR_DEPARTMENT_OTHER): Payer: Self-pay | Admitting: Obstetrics and Gynecology

## 2023-02-18 LAB — SURGICAL PATHOLOGY

## 2023-02-18 NOTE — Anesthesia Postprocedure Evaluation (Signed)
 Anesthesia Post Note  Patient: Aimee Maldonado  Procedure(s) Performed: XI ROBOTIC ASSISTED LAPAROSCOPIC HYSTERECTOMY AND SALPINGECTOMY (Bilateral: Pelvis) CYSTOSCOPY     Patient location during evaluation: PACU Anesthesia Type: General Level of consciousness: awake and alert Pain management: pain level controlled Vital Signs Assessment: post-procedure vital signs reviewed and stable Respiratory status: spontaneous breathing, nonlabored ventilation, respiratory function stable and patient connected to nasal cannula oxygen Cardiovascular status: blood pressure returned to baseline and stable Postop Assessment: no apparent nausea or vomiting Anesthetic complications: no   No notable events documented.  Last Vitals:  Vitals:   02/15/23 1444 02/15/23 1558  BP: (!) 137/109 135/82  Pulse: 75 72  Resp: 20 18  Temp:  36.8 C  SpO2: 93% 98%    Last Pain:  Vitals:   02/15/23 1558  TempSrc: Oral  PainSc:                  Jawanna Dykman D Sammy Cassar

## 2023-02-19 LAB — BPAM RBC
Blood Product Expiration Date: 202501292359
Blood Product Expiration Date: 202502072359
Blood Product Expiration Date: 202502072359
Unit Type and Rh: 600
Unit Type and Rh: 600
Unit Type and Rh: 600

## 2023-02-19 LAB — TYPE AND SCREEN
ABO/RH(D): A NEG
Antibody Screen: NEGATIVE
Unit division: 0
Unit division: 0
Unit division: 0

## 2023-02-22 ENCOUNTER — Encounter: Payer: Self-pay | Admitting: Family Medicine

## 2023-02-22 ENCOUNTER — Ambulatory Visit: Payer: 59 | Admitting: Family Medicine

## 2023-02-22 VITALS — BP 128/80 | HR 86 | Temp 98.4°F | Ht 60.0 in | Wt 143.6 lb

## 2023-02-22 DIAGNOSIS — E876 Hypokalemia: Secondary | ICD-10-CM

## 2023-02-22 DIAGNOSIS — D500 Iron deficiency anemia secondary to blood loss (chronic): Secondary | ICD-10-CM

## 2023-02-22 DIAGNOSIS — R002 Palpitations: Secondary | ICD-10-CM | POA: Diagnosis not present

## 2023-02-22 DIAGNOSIS — R5383 Other fatigue: Secondary | ICD-10-CM

## 2023-02-22 LAB — BASIC METABOLIC PANEL
BUN: 13 mg/dL (ref 6–23)
CO2: 26 meq/L (ref 19–32)
Calcium: 8.9 mg/dL (ref 8.4–10.5)
Chloride: 104 meq/L (ref 96–112)
Creatinine, Ser: 0.73 mg/dL (ref 0.40–1.20)
GFR: 92.69 mL/min (ref >60.00)
Glucose, Bld: 94 mg/dL (ref 70–99)
Potassium: 4.1 meq/L (ref 3.5–5.1)
Sodium: 139 meq/L (ref 135–145)

## 2023-02-22 LAB — CBC WITH DIFFERENTIAL/PLATELET
Basophils Absolute: 0.1 10*3/uL (ref 0.0–0.1)
Basophils Relative: 0.7 % (ref 0.0–3.0)
Eosinophils Absolute: 0.1 10*3/uL (ref 0.0–0.7)
Eosinophils Relative: 0.7 % (ref 0.0–5.0)
HCT: 36.9 % (ref 36.0–46.0)
Hemoglobin: 11.9 g/dL — ABNORMAL LOW (ref 12.0–15.0)
Lymphocytes Relative: 22.5 % (ref 12.0–46.0)
Lymphs Abs: 2.1 10*3/uL (ref 0.7–4.0)
MCHC: 32.3 g/dL (ref 30.0–36.0)
MCV: 89.3 fL (ref 78.0–100.0)
Monocytes Absolute: 0.7 10*3/uL (ref 0.1–1.0)
Monocytes Relative: 7.6 % (ref 3.0–12.0)
Neutro Abs: 6.5 10*3/uL (ref 1.4–7.7)
Neutrophils Relative %: 68.5 % (ref 43.0–77.0)
Platelets: 479 10*3/uL — ABNORMAL HIGH (ref 150.0–400.0)
RBC: 4.14 Mil/uL (ref 3.87–5.11)
RDW: 14.4 % (ref 11.5–15.5)
WBC: 9.4 10*3/uL (ref 4.0–10.5)

## 2023-02-22 NOTE — Patient Instructions (Signed)
Thank you for coming in today.  I did place a referral to cardiology for you to be seen hopefully within the next week to discuss the palpitations.  Your previous potassium level was low, I will recheck that today to make sure that is not a contributor.  I will also recheck your blood count with a history of anemia.  As we discussed there can be many causes of fatigue, but with the palpitations I do want to look at other possible acute causes at this time.  Keep follow-up with gynecology as planned.  If any new or worsening symptoms as we discussed, proceed to the emergency room.  Palpitations Palpitations are feelings that your heartbeat is irregular or is faster than normal. It may feel like your heart is fluttering or skipping a beat. Palpitations may be caused by many things, including smoking, caffeine, alcohol, stress, and certain medicines or drugs. Most causes of palpitations are not serious.  However, some palpitations can be a sign of a serious problem. Further tests and a thorough medical history will be done to find the cause of your palpitations. Your provider may order tests such as an ECG, labs, an echocardiogram, or an ambulatory continuous ECG monitor. Follow these instructions at home: Pay attention to any changes in your symptoms. Let your health care provider know about them. Take these actions to help manage your symptoms: Eating and drinking Follow instructions from your health care provider about eating or drinking restrictions. You may need to avoid foods and drinks that may cause palpitations. These may include: Caffeinated coffee, tea, soft drinks, and energy drinks. Chocolate. Alcohol. Diet pills. Lifestyle     Take steps to reduce your stress and anxiety. Things that can help you relax include: Yoga. Mind-body activities, such as deep breathing, meditation, or using words and images to create positive thoughts (guided imagery). Physical activity, such as swimming,  jogging, or walking. Tell your health care provider if your palpitations increase with activity. If you have chest pain or shortness of breath with activity, do not continue the activity until you are seen by your health care provider. Biofeedback. This is a method that helps you learn to use your mind to control things in your body, such as your heartbeat. Get plenty of rest and sleep. Keep a regular bed time. Do not use drugs, including cocaine or ecstasy. Do not use marijuana. Do not use any products that contain nicotine or tobacco. These products include cigarettes, chewing tobacco, and vaping devices, such as e-cigarettes. If you need help quitting, ask your health care provider. General instructions Take over-the-counter and prescription medicines only as told by your health care provider. Keep all follow-up visits. This is important. These may include visits for further testing if palpitations do not go away or get worse. Contact a health care provider if: You continue to have a fast or irregular heartbeat for a long period of time. You notice that your palpitations occur more often. Get help right away if: You have chest pain or shortness of breath. You have a severe headache. You feel dizzy or you faint. These symptoms may represent a serious problem that is an emergency. Do not wait to see if the symptoms will go away. Get medical help right away. Call your local emergency services (911 in the [place] Do not drive yourself to the hospital. Summary Palpitations are feelings that your heartbeat is irregular or is faster than normal. It may feel like your heart is fluttering or skipping  a beat. Palpitations may be caused by many things, including smoking, caffeine, alcohol, stress, certain medicines, and drugs. Further tests and a thorough medical history may be done to find the cause of your palpitations. Get help right away if you faint or have chest pain, shortness of breath, severe  headache, or dizziness. This information is not intended to replace advice given to you by your health care provider. Make sure you discuss any questions you have with your health care provider. Document Revised: 06/15/2020 Document Reviewed: 06/15/2020 Elsevier Patient Education  2024 [employer].

## 2023-02-22 NOTE — Progress Notes (Unsigned)
Subjective:  Patient ID: Aimee Maldonado, female    DOB: 05-11-1965  Age: 58 y.o. MRN: [id number]  CC:  Chief Complaint  Patient presents with   Fatigue    Pt notes fatigue has not been much improved has Urgent Hysterectomy on Friday, due to [facility] bleeding for one month, notes her blood counts were all messed up,    Palpitations    Pt notes feels her heart is beating hard and abnormal notes started after surgery     HPI Aimee Maldonado presents for   Fatigue, palpitations Followed by GYN, Dr. Karma Greaser, robotic assisted laparoscopic hysterectomy and salpingectomy on 02/15/2023 for menorrhagia, adenomyosis.  Iron deficiency anemia due to chronic blood loss and dysmenorrhea. She had previously received blood transfusion  - 1 unit on January 8 for symptomatic anemia.  Hemoglobin 8.8 on 02/12/2023, 10.6 on 02/15/2023.  Pathology from hysterectomy negative for dysplasia, hyperplasia or malignancy.  Since her surgery 1 week ago, she has noted some intermittent symptoms of harder heartbeat after the surgery. Notice with lying down at night more, but during the day at times - comes and goes. Feels some dyspnea when this occurs. No chest pain, just heavy feeling in chest, with faster heart rate. Husband is physician - checked HR - faster and irregular at times when he checked HR.  BP has been stable - 120/80 range 0 same as in office today.   Also with persistent fatigue. About the same fatigue. 10.6 hgb was prior to surgery. Some intermittent lightheadedness. No syncope/fall.  No melena/hematochezia. Possible SVT when pregnant  14 years ago. No recurrence and no hx of afib?  Cardiology eval with Dr. Bjorn Pippin in 2022 - planned on confirmation on zio patch  - this was never done. Did have echo 03/08/20 without significant abnormality.   Was not having symptoms with EKG today. Last felt sx's last night.  Potassium low at 3.0 on 12/31. once on 02/05/23. No current potassium supplementation.    Lab  Results  Component Value Date   WBC 11.4 (H) 02/15/2023   HGB 10.6 (L) 02/15/2023   HCT 32.9 (L) 02/15/2023   MCV 92.7 02/15/2023   PLT 507 (H) 02/15/2023   Lab Results  Component Value Date   TSH 1.15 01/10/2023   Lab Results  Component Value Date   NA 135 02/05/2023   K 3.0 (L) 02/05/2023   CL 102 02/05/2023   CO2 24 02/05/2023     History Patient Active Problem List   Diagnosis Date Noted   Abnormal uterine bleeding (AUB) 01/11/2023   MDD (major depressive disorder) 01/11/2023   GAD (generalized anxiety disorder) 01/11/2023   Hypertension 01/11/2023   SVT (supraventricular tachycardia) (HCC) 08/11/2020   Past Medical History:  Diagnosis Date   Adenomyosis of uterus    Dysmenorrhea    Facial spasm    neurology-- j. mcdaniel NP ([facility])  facial spasm intermittant, affect the masseter muscle;   MRI in care everywhere with no potential cause of spasm per note and possible mental health   GAD (generalized anxiety disorder)    GERD (gastroesophageal reflux disease)    History of atrial fibrillation 2010   per pt told during second pregnancy age 89,  w/ palpitations and tachycardia, hospitalized in [place] told had SVT when she looked back at her records,  stated palpitations resolved with delivery   History of supraventricular tachycardia 2010   cardiology in [place] evalution 02-12-2020 for elevated bp w/ dx HTN and  palpitations,  echo done 03-08-2020 ef 65-70%, G1DD, no valvular issue, poss. PFO,  no ZIO monitor done pt refused when pt went back in her records found she had SVT not Afib episode when preg at 17 lov note 07-28-2020-- stated very rare palpitation,  pt did not f/u in 1 yr as recommended   Hypertension    Iron deficiency anemia due to chronic blood loss    MDD (major depressive disorder)    Menorrhagia with irregular cycle    Symptomatic anemia    SOB,  dizzy,  lightedness, fatigue   Wears contact lenses    Past Surgical  History:  Procedure Laterality Date   ABDOMINAL HYSTERECTOMY  02/15/2023   CESAREAN SECTION  05/2009   CYSTOSCOPY N/A 02/15/2023   Procedure: CYSTOSCOPY;  Surgeon: Earley Favor, MD;  Location[facility];  Service: Gynecology;  Laterality: N/A;   DILATION AND EVACUATION  2006   miscarriage   ROBOTIC ASSISTED LAPAROSCOPIC HYSTERECTOMY AND SALPINGECTOMY Bilateral 02/15/2023   Procedure: XI ROBOTIC ASSISTED LAPAROSCOPIC HYSTERECTOMY AND SALPINGECTOMY;  Surgeon: Earley Favor, MD;  Location: [facility];  Service: Gynecology;  Laterality: Bilateral;   No Known Allergies Prior to Admission medications   Medication Sig Start Date End Date Taking? Authorizing Provider  ALPRAZolam Prudy Feeler) 0.25 MG tablet Take 1 tablet by mouth daily as needed.   Yes [provider]  AUVELITY 45-105 MG TBCR Take 1 tablet by mouth 2 (two) times daily. 05/17/22  Yes [provider]  calcium carbonate (TUMS - DOSED IN MG ELEMENTAL CALCIUM) 500 MG chewable tablet Chew 1 tablet by mouth as directed.   Yes [provider]  hydrochlorothiazide (HYDRODIURIL) 25 MG tablet Take 1 tablet (25 mg total) by mouth daily. Patient taking differently: Take 25 mg by mouth daily. 06/06/22  Yes Shade Flood, MD  ibuprofen (ADVIL) 200 MG tablet Take 400 mg by mouth every 6 (six) hours as needed.   Yes [provider]  ibuprofen (MOTRIN IB) 200 MG tablet Take 1 tablet (200 mg total) by mouth every 6 (six) hours as needed. 02/15/23  Yes Earley Favor, MD  metoCLOPramide (REGLAN) 10 MG tablet Take 1 tablet (10 mg total) by mouth every 8 (eight) hours as needed for nausea. 02/15/23  Yes Earley Favor, MD  metoprolol succinate (TOPROL-XL) 50 MG 24 hr tablet Take 1 tablet (50 mg total) by mouth daily. Take with or immediately following a meal. Patient taking differently: Take 50 mg by mouth daily. Take with or immediately following a meal. 06/06/22  Yes  Shade Flood, MD  naproxen (NAPROSYN) 500 MG tablet Take 1 tablet (500 mg total) by mouth 2 (two) times daily. Patient taking differently: Take 500 mg by mouth 2 (two) times daily as needed. 02/02/23  Yes Durwin Glaze, MD  ondansetron (ZOFRAN) 4 MG tablet Take 1 tablet (4 mg total) by mouth every 6 (six) hours. 02/02/23  Yes Durwin Glaze, MD  oxyCODONE (ROXICODONE) 5 MG immediate release tablet Take 1 tablet (5 mg total) by mouth every 4 (four) hours as needed for severe pain (pain score 7-10). 02/15/23  Yes Earley Favor, MD  traZODone (DESYREL) 50 MG tablet Take 1-2 tablets by mouth at bedtime as needed for sleep.   Yes [provider]   Social History   Socioeconomic History   Marital status: Married    Spouse name: Not on file   Number of children: Not on file  Years of education: Not on file   Highest education level: Not on file  Occupational History   Not on file  Tobacco Use   Smoking status: Never    Passive exposure: Never   Smokeless tobacco: Never  Vaping Use   Vaping status: Never Used  Substance and Sexual Activity   Alcohol use: No   Drug use: Never   Sexual activity: Yes    Partners: Male    Birth control/protection: None    Comment: menarche 58yo, sexual debut 58yo  Other Topics Concern   Not on file  Social History Narrative   Not on file   Social Drivers of Health   Financial Resource Strain: Patient Declined (10/24/2022)   Received from [facility]   Overall Financial Resource Strain (CARDIA)    Difficulty of Paying Living Expenses: Patient declined  Food Insecurity: Patient Declined (10/24/2022)   Received from [facility]   Hunger Vital Sign    Worried About Running Out of Food in the Last Year: Patient declined    Ran Out of Food in the Last Year: Patient declined  Transportation Needs: Patient Declined (10/24/2022)   Received from [facility] - Transportation    Lack of Transportation (Medical): Patient  declined    Lack of Transportation (Non-Medical): Patient declined  Physical Activity: Not on file  Stress: Not on file  Social Connections: Unknown (09/28/2022)   Received from [facility]   Social Network    Social Network: Not on file  Intimate Partner Violence: Unknown (09/28/2022)   Received from [facility]   HITS    Physically Hurt: Not on file    Insult or Talk Down To: Not on file    Threaten Physical Harm: Not on file    Scream or Curse: Not on file    Review of Systems Per HPI  Objective:   Vitals:   02/22/23 1115  BP: 128/80  Pulse: 86  Temp: 98.4 F (36.9 C)  TempSrc: Temporal  SpO2: 99%  Weight: 143 lb 9.6 oz (65.1 kg)  Height: 5' (1.524 m)     Physical Exam Vitals reviewed.  Constitutional:      Appearance: Normal appearance. She is well-developed.  HENT:     Head: Normocephalic and atraumatic.  Eyes:     Conjunctiva/sclera: Conjunctivae normal.     Pupils: Pupils are equal, round, and reactive to light.  Neck:     Vascular: No carotid bruit.  Cardiovascular:     Rate and Rhythm: Normal rate and regular rhythm.     Heart sounds: Normal heart sounds.  Pulmonary:     Effort: Pulmonary effort is normal.     Breath sounds: Normal breath sounds.  Abdominal:     General: There is no distension.     Palpations: Abdomen is soft. There is no pulsatile mass.     Tenderness: There is no abdominal tenderness.  Musculoskeletal:     Right lower leg: No edema.     Left lower leg: No edema.  Skin:    General: Skin is warm and dry.  Neurological:     Mental Status: She is alert and oriented to person, place, and time.  Psychiatric:        Mood and Affect: Mood normal.        Behavior: Behavior normal.      EKG, sinus rhythm with rate of 80, no apparent acute ST or T wave changes.  Flat T waves lateral leads, as  well as inferior leads.  Compared to EKG 07/28/2020, no apparent significant changes other than prior prolonged QTc of 461, 433  today.   Results for orders placed or performed in visit on 02/22/23  CBC with Differential/Platelet   Collection Time: 02/22/23 12:21 PM  Result Value Ref Range   WBC 9.4 4.0 - 10.5 K/uL   RBC 4.14 3.87 - 5.11 Mil/uL   Hemoglobin 11.9 (L) 12.0 - 15.0 g/dL   HCT 16.1 09.6 - 04.5 %   MCV 89.3 78.0 - 100.0 fl   MCHC 32.3 30.0 - 36.0 g/dL   RDW 40.9 81.1 - 91.4 %   Platelets 479.0 (H) 150.0 - 400.0 K/uL   Neutrophils Relative % 68.5 43.0 - 77.0 %   Lymphocytes Relative 22.5 12.0 - 46.0 %   Monocytes Relative 7.6 3.0 - 12.0 %   Eosinophils Relative 0.7 0.0 - 5.0 %   Basophils Relative 0.7 0.0 - 3.0 %   Neutro Abs 6.5 1.4 - 7.7 K/uL   Lymphs Abs 2.1 0.7 - 4.0 K/uL   Monocytes Absolute 0.7 0.1 - 1.0 K/uL   Eosinophils Absolute 0.1 0.0 - 0.7 K/uL   Basophils Absolute 0.1 0.0 - 0.1 K/uL  Basic metabolic panel   Collection Time: 02/22/23 12:21 PM  Result Value Ref Range   Sodium 139 135 - 145 mEq/L   Potassium 4.1 3.5 - 5.1 mEq/L   Chloride 104 96 - 112 mEq/L   CO2 26 19 - 32 mEq/L   Glucose, Bld 94 70 - 99 mg/dL   BUN 13 6 - 23 mg/dL   Creatinine, Ser 7.82 0.40 - 1.20 mg/dL   GFR 95.62 >13.08 mL/min   Calcium 8.9 8.4 - 10.5 mg/dL    Assessment & Plan:  Orionna Binkerd is a 58 y.o. female . Palpitation - Plan: CBC with Differential/Platelet, EKG 12-Lead, Basic metabolic panel, Ambulatory referral to Cardiology  Hypokalemia - Plan: Basic metabolic panel  Fatigue, unspecified type - Plan: Basic metabolic panel, Ambulatory referral to Cardiology  Iron deficiency anemia due to chronic blood loss - Plan: CBC with Differential/Platelet, Basic metabolic panel  Repeat labs reassuring with only borderline anemia, significantly improved from previous levels and hypokalemia has resolved.  Fatigue could be related to expected fatigue after surgery, EKG as above with intermittent palpitations.  No apparent acute findings but cardiology referral placed with ER precautions given if any acute  or worsening symptoms.  Keep follow-up with gynecology as planned.  No orders of the defined types were placed in this encounter.  Patient Instructions  Thank you for coming in today.  I did place a referral to cardiology for you to be seen hopefully within the next week to discuss the palpitations.  Your previous potassium level was low, I will recheck that today to make sure that is not a contributor.  I will also recheck your blood count with a history of anemia.  As we discussed there can be many causes of fatigue, but with the palpitations I do want to look at other possible acute causes at this time.  Keep follow-up with gynecology as planned.  If any new or worsening symptoms as we discussed, proceed to the emergency room.  Palpitations Palpitations are feelings that your heartbeat is irregular or is faster than normal. It may feel like your heart is fluttering or skipping a beat. Palpitations may be caused by many things, including smoking, caffeine, alcohol, stress, and certain medicines or drugs. Most causes of palpitations are not  serious.  However, some palpitations can be a sign of a serious problem. Further tests and a thorough medical history will be done to find the cause of your palpitations. Your provider may order tests such as an ECG, labs, an echocardiogram, or an ambulatory continuous ECG monitor. Follow these instructions at home: Pay attention to any changes in your symptoms. Let your health care provider know about them. Take these actions to help manage your symptoms: Eating and drinking Follow instructions from your health care provider about eating or drinking restrictions. You may need to avoid foods and drinks that may cause palpitations. These may include: Caffeinated coffee, tea, soft drinks, and energy drinks. Chocolate. Alcohol. Diet pills. Lifestyle     Take steps to reduce your stress and anxiety. Things that can help you relax include: Yoga. Mind-body  activities, such as deep breathing, meditation, or using words and images to create positive thoughts (guided imagery). Physical activity, such as swimming, jogging, or walking. Tell your health care provider if your palpitations increase with activity. If you have chest pain or shortness of breath with activity, do not continue the activity until you are seen by your health care provider. Biofeedback. This is a method that helps you learn to use your mind to control things in your body, such as your heartbeat. Get plenty of rest and sleep. Keep a regular bed time. Do not use drugs, including cocaine or ecstasy. Do not use marijuana. Do not use any products that contain nicotine or tobacco. These products include cigarettes, chewing tobacco, and vaping devices, such as e-cigarettes. If you need help quitting, ask your health care provider. General instructions Take over-the-counter and prescription medicines only as told by your health care provider. Keep all follow-up visits. This is important. These may include visits for further testing if palpitations do not go away or get worse. Contact a health care provider if: You continue to have a fast or irregular heartbeat for a long period of time. You notice that your palpitations occur more often. Get help right away if: You have chest pain or shortness of breath. You have a severe headache. You feel dizzy or you faint. These symptoms may represent a serious problem that is an emergency. Do not wait to see if the symptoms will go away. Get medical help right away. Call your local emergency services (911 in the [place] Do not drive yourself to the hospital. Summary Palpitations are feelings that your heartbeat is irregular or is faster than normal. It may feel like your heart is fluttering or skipping a beat. Palpitations may be caused by many things, including smoking, caffeine, alcohol, stress, certain medicines, and drugs. Further tests and a  thorough medical history may be done to find the cause of your palpitations. Get help right away if you faint or have chest pain, shortness of breath, severe headache, or dizziness. This information is not intended to replace advice given to you by your health care provider. Make sure you discuss any questions you have with your health care provider. Document Revised: 06/15/2020 Document Reviewed: 06/15/2020 Elsevier Patient Education  2024 Elsevier Inc.    Signed,   Meredith Staggers, MD [facility], [facility] 02/22/23 11:27 AM

## 2023-02-24 ENCOUNTER — Encounter: Payer: Self-pay | Admitting: Family Medicine

## 2023-02-26 ENCOUNTER — Encounter (HOSPITAL_BASED_OUTPATIENT_CLINIC_OR_DEPARTMENT_OTHER): Payer: Self-pay

## 2023-02-28 ENCOUNTER — Encounter: Payer: Self-pay | Admitting: Obstetrics and Gynecology

## 2023-02-28 ENCOUNTER — Ambulatory Visit: Payer: 59 | Admitting: Obstetrics and Gynecology

## 2023-02-28 VITALS — BP 116/80 | HR 70 | Resp 16

## 2023-02-28 DIAGNOSIS — E2839 Other primary ovarian failure: Secondary | ICD-10-CM

## 2023-02-28 DIAGNOSIS — Z09 Encounter for follow-up examination after completed treatment for conditions other than malignant neoplasm: Secondary | ICD-10-CM

## 2023-02-28 NOTE — Progress Notes (Signed)
Patient presents for 2 week postop from [facility], bilateral salpingectomy, cystoscopy. She is doing well. No fevers, VB, dysuria or severe abdominal pain.  BP 116/80   Pulse 70   Resp 16   LMP 12/22/2022 (Approximate)   Abdomen: incisions I/c/d, NT, ND  A/p PO from [facility] 2 weeks doing well Encouraged no heavy lifting, pushing, pulling greater than 10 lbs for full 8 weeks 2. Pelvic rest for the entire 10 wks 3. RTC with any concerns or with heavy bleeding, fevers or severe abdominal pain.  Dr. Karma Greaser

## 2023-03-08 ENCOUNTER — Ambulatory Visit: Payer: 59 | Admitting: Family Medicine

## 2023-03-08 ENCOUNTER — Encounter: Payer: Self-pay | Admitting: Family Medicine

## 2023-03-08 VITALS — BP 130/78 | HR 84 | Temp 98.6°F | Ht 60.0 in | Wt 142.2 lb

## 2023-03-08 DIAGNOSIS — Z6827 Body mass index (BMI) 27.0-27.9, adult: Secondary | ICD-10-CM

## 2023-03-08 DIAGNOSIS — R7303 Prediabetes: Secondary | ICD-10-CM | POA: Diagnosis not present

## 2023-03-08 DIAGNOSIS — E663 Overweight: Secondary | ICD-10-CM | POA: Diagnosis not present

## 2023-03-08 DIAGNOSIS — R5383 Other fatigue: Secondary | ICD-10-CM

## 2023-03-08 MED ORDER — WEGOVY 0.25 MG/0.5ML ~~LOC~~ SOAJ: 0.2500 mg | SUBCUTANEOUS | 1 refills | Status: DC

## 2023-03-08 NOTE — Patient Instructions (Signed)
Thanks for coming in today.  Glad to hear the fatigue has improved but with some of the residual fatigue I agree with you that this could be multiple contributors.  Premenopausal symptoms certainly can affect fatigue, as well as depression and lack of interest or anhedonia.  Continue follow-up as planned with psychiatry to discuss if medication changes needed as well as GYN to discuss the perimenopausal symptoms.  If any new or worsening symptoms we certainly can repeat blood work but I think it is reasonable to hold off on that today.  See information below regarding prediabetes.  I think it is worth a trial to see if Reginal Lutes will be covered.  I have sent the low-dose to your pharmacy.  If they do not cover that medication with prior authorization, we can discuss other options if those would be covered like Ozempic or Mounjaro, but those would be off label use if using strictly for weight loss and prediabetes.  Follow-up in 1 month but we should hear something from the pharmacy soon and can change plan  if needed.  Take care!   Prediabetes: What to Know Prediabetes is when your blood sugar, also called glucose, is at a higher level than normal but not high enough for you to be diagnosed with type 2 diabetes (type 2 diabetes mellitus). Having prediabetes puts you at risk for getting type 2 diabetes. By making some healthy changes, you may be able to prevent or delay getting type 2 diabetes. This is important because type 2 diabetes can lead to serious problems. Some of these include: Heart disease. Stroke. Blindness. Kidney disease. Depression. Poor blood flow in the feet and legs. In very bad cases, this could lead to having a leg removed by surgery (amputation). What are the causes? The exact cause of prediabetes isn't known. It may result from insulin resistance. Insulin resistance happens when cells in the body don't respond properly to insulin that the body makes. This can cause too much sugar to  build up in the blood. High blood sugar, also called hyperglycemia, can develop. What increases the risk? Having a family member with type 2 diabetes. Being older than 56 years of age. Having had a temporary form of diabetes during a pregnancy. This is called gestational diabetes. Having had polycystic ovary syndrome (PCOS). Being overweight or obese. Being inactive and not getting much exercise. Having a history of heart disease. This may include problems with cholesterol levels, high levels of blood fats, or high blood pressure. What are the signs or symptoms? You may have no symptoms. If you do have symptoms, they may include: Increased hunger. Increased thirst. Needing to pee more often. Changes in how you see, like blurry vision. Feeling tired. How is this diagnosed? Prediabetes can be diagnosed with blood tests that check your blood sugar. One or more of these tests may be done: A fasting blood glucose (FBG) test. You won't be allowed to eat (you will fast) for at least 8 hours before a blood sample is taken. An A1C blood test, also called a hemoglobin A1C test. This test shows information about blood sugar levels over the past 2?3 months. An oral glucose tolerance test (OGTT). This test measures your blood sugar at two points in time: After you haven't eaten for a while. This is your baseline level. Two hours after you drink a beverage that has sugar in it. You may be diagnosed with prediabetes if: Your FBG is 100?125 mg/dL (1.6-1.0 mmol/L). Your A1C level is  5.7?6.4% (39-46 mmol/mol). Your OGTT result is 140?199 mg/dL (4.0-98 mmol/L). These blood tests may need to be done again to be sure of the diagnosis. How is this treated? Treatment may include making changes to your diet and lifestyle. These changes can help lower your blood sugar and keep you from getting type 2 diabetes. In some cases, medicine may be given to help lower your risk. Follow these instructions at  home: Eating and drinking  Eat and drink as told. Follow a healthy meal plan. This includes eating lean proteins, whole grains, legumes, fresh fruits and vegetables, low-fat dairy products, and healthy fats. Meet with an expert in healthy eating called a dietitian. This person can help create a healthy eating plan that's right for you. Lifestyle Do moderate-intensity exercise. Do this for at least 30 minutes a day on 5 or more days each week, or as told by your health care provider. A mix of activities may be best. Good choices include brisk walking, swimming, biking, and weight lifting. Try to lose weight if your provider says it's OK. Losing 5-7% of your body weight can help reverse insulin resistance. Do not drink alcohol if: Your provider tells you not to drink. You're pregnant, may be pregnant, or plan to become pregnant. If you drink alcohol: Limit how much you have to: 0-1 drink a day if you're female. 0-2 drinks a day if you're female. Know how much alcohol is in your drink. In the [place] one drink is one 12 oz bottle of beer (355 mL), one 5 oz glass of wine (148 mL), or one 1 oz glass of hard liquor (44 mL). General instructions Take medicines only as told. You may be given medicines that help lower the risk of type 2 diabetes. Do not smoke, vape, or use nicotine or tobacco. Where to find more information American Diabetes Association: diabetes.org/about-diabetes/prediabetes Academy of Nutrition and Dietetics: eatright.org American Heart Association: Go to ThisJobs.cz. Click the search icon. Type "prediabetes" in the search box. Contact a health care provider if: You have any of these symptoms: Increased hunger. Peeing more often than usual. Increased thirst. Feeling tired. Changes in how you see, like blurry vision. Feeling like you may throw up. Throwing up. Get help right away if: You have shortness of breath. You feel confused. This information is not intended to  replace advice given to you by your health care provider. Make sure you discuss any questions you have with your health care provider. Document Revised: 08/26/2022 Document Reviewed: 08/26/2022 Elsevier Patient Education  2024 [employer].

## 2023-03-08 NOTE — Progress Notes (Signed)
Subjective:  Patient ID: Aimee Maldonado, female    DOB: [birth year]  Age: 58 y.o. MRN: [id number]  CC:  Chief Complaint  Patient presents with   Fatigue    Pt notes okay, could be better, no no questions today    Results    OBGYN did labs and aparently showed she could be prediabetic and wanted to inform pcp     HPI Aimee Maldonado presents for   Fatigue, palpitations Follow-up from January 17 visit.  At that time had a recent hysterectomy for menorrhagia, adenomyosis on 02/15/2023.  Iron deficiency anemia previously requiring transfusion January eights for symptomatic anemia.  Since surgery she had been experiencing intermittent palpitations with harder heartbeat, some dyspnea with the symptoms, some chest heaviness with the faster heart rate, but no pain.  She had noted some faster heart rate with possible irregular heart rate when checked by her spouse at home.  Some persistent fatigue noted at that time as well, and had reported intermittent lightheadedness without syncope or falls.  No melena hematochezia.  Possible SVT when pregnant years ago but no recurrence and no known history of A-fib.  She had been seen by cardiology in 2022, planned on confirmation on Zio patch but that had not been performed.  Echo in February 2022 without significant abnormality. Asymptomatic during last office visit at time of EKG.  Previous symptoms night prior. Of note she did have a hypokalemia of 3.0 on 1231 treated with 40 mEq but no recurrent supplementation. Repeat CBC with only borderline anemia, significantly improved from previous levels and hypokalemia had resolved on repeat labs.  Cardiology referral was placed to decide on further testing or possible Zio patch. Cardiology appointment scheduled February 4. Postop check with GYN on January 23.  Feeling a lot better. Fatigue improved. Thinks that some of her fatigue is related to depression - followed by NP at [facility]. On [place]. Some  anhedonia with fatigue. She is wondering about perimenopausal effect. Plan for discussion of perimenopausal sx's with Dr. Karma Greaser in March.  Considering new meds with psychiatry. Hesitant as some side effects with prior changes.  Defers repeat blood work today.  No recurrence of palpitations recently.   Lab Results  Component Value Date   WBC 9.4 02/22/2023   HGB 11.9 (L) 02/22/2023   HCT 36.9 02/22/2023   MCV 89.3 02/22/2023   PLT 479.0 (H) 02/22/2023   Lab Results  Component Value Date   NA 139 02/22/2023   K 4.1 02/22/2023   CL 104 02/22/2023   CO2 26 02/22/2023   Prediabetes: Elevated A1c noted January 3.  Previously 5.6. Difficulty with losing weight - trying to watch diet, exercise. Still gaining weight. Would like to try med like ozempic or Mounjaro. Has checked with insurance and may have coverage for weight loss meds.  Preauth for [employer], Zepbound not covered.  Concerned about progressing to diabetes.   Lab Results  Component Value Date   HGBA1C 5.8 (H) 02/08/2023   Wt Readings from Last 3 Encounters:  03/08/23 142 lb 3.2 oz (64.5 kg)  02/22/23 143 lb 9.6 oz (65.1 kg)  02/15/23 145 lb 1.6 oz (65.8 kg)  Body mass index is 27.77 kg/m.  Lab Results  Component Value Date   TSH 1.15 01/10/2023      History Patient Active Problem List   Diagnosis Date Noted   Abnormal uterine bleeding (AUB) 01/11/2023   MDD (major depressive disorder) 01/11/2023   GAD (generalized anxiety disorder) 01/11/2023  Hypertension 01/11/2023   SVT (supraventricular tachycardia) (HCC) 08/11/2020   Past Medical History:  Diagnosis Date   Adenomyosis of uterus    Dysmenorrhea    Facial spasm    neurology-- j. mcdaniel NP ([facility])  facial spasm intermittant, affect the masseter muscle;   MRI in care everywhere with no potential cause of spasm per note and possible mental health   GAD (generalized anxiety disorder)    GERD (gastroesophageal reflux  disease)    History of atrial fibrillation 2010   per pt told during second pregnancy age 66,  w/ palpitations and tachycardia, hospitalized in [place] told had SVT when she looked back at her records,  stated palpitations resolved with delivery   History of supraventricular tachycardia 2010   cardiology in [place] evalution 02-12-2020 for elevated bp w/ dx HTN and palpitations,  echo done 03-08-2020 ef 65-70%, G1DD, no valvular issue, poss. PFO,  no ZIO monitor done pt refused when pt went back in her records found she had SVT not Afib episode when preg at 55 lov note 07-28-2020-- stated very rare palpitation,  pt did not f/u in 1 yr as recommended   Hypertension    Iron deficiency anemia due to chronic blood loss    MDD (major depressive disorder)    Menorrhagia with irregular cycle    Symptomatic anemia    SOB,  dizzy,  lightedness, fatigue   Wears contact lenses    Past Surgical History:  Procedure Laterality Date   ABDOMINAL HYSTERECTOMY  02/15/2023   CESAREAN SECTION  05/2009   CYSTOSCOPY N/A 02/15/2023   Procedure: CYSTOSCOPY;  Surgeon: Earley Favor, MD;  Location: [facility];  Service: Gynecology;  Laterality: N/A;   DILATION AND EVACUATION  2006   miscarriage   ROBOTIC ASSISTED LAPAROSCOPIC HYSTERECTOMY AND SALPINGECTOMY Bilateral 02/15/2023   Procedure: XI ROBOTIC ASSISTED LAPAROSCOPIC HYSTERECTOMY AND SALPINGECTOMY;  Surgeon: Earley Favor, MD;  Location: [facility];  Service: Gynecology;  Laterality: Bilateral;   No Known Allergies Prior to Admission medications   Medication Sig Start Date End Date Taking? Authorizing Provider  ALPRAZolam Prudy Feeler) 0.25 MG tablet Take 1 tablet by mouth daily as needed.    [provider]  AUVELITY 45-105 MG TBCR Take 1 tablet by mouth 2 (two) times daily. 05/17/22   [provider]  calcium carbonate (TUMS - DOSED IN MG ELEMENTAL CALCIUM) 500 MG chewable tablet Chew 1 tablet  by mouth as directed.    [provider]  hydrochlorothiazide (HYDRODIURIL) 25 MG tablet Take 1 tablet (25 mg total) by mouth daily. Patient taking differently: Take 25 mg by mouth daily. 06/06/22   Shade Flood, MD  ibuprofen (ADVIL) 200 MG tablet Take 400 mg by mouth every 6 (six) hours as needed.    [provider]  metoCLOPramide (REGLAN) 10 MG tablet Take 1 tablet (10 mg total) by mouth every 8 (eight) hours as needed for nausea. 02/15/23   Earley Favor, MD  metoprolol succinate (TOPROL-XL) 50 MG 24 hr tablet Take 1 tablet (50 mg total) by mouth daily. Take with or immediately following a meal. Patient taking differently: Take 50 mg by mouth daily. Take with or immediately following a meal. 06/06/22   Shade Flood, MD  naproxen (NAPROSYN) 500 MG tablet Take 1 tablet (500 mg total) by mouth 2 (two) times daily. Patient taking differently: Take 500 mg by mouth 2 (two) times daily as needed. 02/02/23   Rhae Hammock,  Resa Miner, MD  ondansetron (ZOFRAN) 4 MG tablet Take 1 tablet (4 mg total) by mouth every 6 (six) hours. 02/02/23   Durwin Glaze, MD  oxyCODONE (ROXICODONE) 5 MG immediate release tablet Take 1 tablet (5 mg total) by mouth every 4 (four) hours as needed for severe pain (pain score 7-10). 02/15/23   Earley Favor, MD  traZODone (DESYREL) 50 MG tablet Take 1-2 tablets by mouth at bedtime as needed for sleep.    [provider]   Social History   Socioeconomic History   Marital status: Married    Spouse name: Not on file   Number of children: Not on file   Years of education: Not on file   Highest education level: Not on file  Occupational History   Not on file  Tobacco Use   Smoking status: Never    Passive exposure: Never   Smokeless tobacco: Never  Vaping Use   Vaping status: Never Used  Substance and Sexual Activity   Alcohol use: No   Drug use: Never   Sexual activity: Not Currently    Partners: Male    Birth control/protection:  Surgical    Comment: menarche 58yo, sexual debut 58yo, hysterectomy  Other Topics Concern   Not on file  Social History Narrative   Not on file   Social Drivers of Health   Financial Resource Strain: Patient Declined (10/24/2022)   Received [facility]   Overall Financial Resource Strain (CARDIA)    Difficulty of Paying Living Expenses: Patient declined  Food Insecurity: Patient Declined (10/24/2022)   Received from [facility]   Hunger Vital Sign    Worried About Running Out of Food in the Last Year: Patient declined    Ran Out of Food in the Last Year: Patient declined  Transportation Needs: Patient Declined (10/24/2022)   Received from [facility] - Transportation    Lack of Transportation (Medical): Patient declined    Lack of Transportation (Non-Medical): Patient declined  Physical Activity: Not on file  Stress: Not on file  Social Connections: Unknown (09/28/2022)   Received from [facility]   Social Network    Social Network: Not on file  Intimate Partner Violence: Unknown (09/28/2022)   Received from [facility]   HITS    Physically Hurt: Not on file    Insult or Talk Down To: Not on file    Threaten Physical Harm: Not on file    Scream or Curse: Not on file    Review of Systems   Objective:   Vitals:   03/08/23 1201  BP: 130/78  Pulse: 84  Temp: 98.6 F (37 C)  TempSrc: Temporal  SpO2: 99%  Weight: 142 lb 3.2 oz (64.5 kg)  Height: 5' (1.524 m)     Physical Exam Vitals reviewed.  Constitutional:      Appearance: Normal appearance. She is well-developed.  HENT:     Head: Normocephalic and atraumatic.  Eyes:     Conjunctiva/sclera: Conjunctivae normal.     Pupils: Pupils are equal, round, and reactive to light.  Neck:     Vascular: No carotid bruit.  Cardiovascular:     Rate and Rhythm: Normal rate and regular rhythm.     Heart sounds: Normal heart sounds.  Pulmonary:     Effort: Pulmonary effort is normal.     Breath  sounds: Normal breath sounds.  Abdominal:     Palpations: Abdomen is soft. There is no pulsatile mass.  Tenderness: There is no abdominal tenderness.  Musculoskeletal:     Right lower leg: No edema.     Left lower leg: No edema.  Skin:    General: Skin is warm and dry.  Neurological:     Mental Status: She is alert and oriented to person, place, and time.  Psychiatric:        Mood and Affect: Mood normal.        Behavior: Behavior normal.        Assessment & Plan:  Aimee Maldonado is a 58 y.o. female . Overweight with body mass index (BMI) of 27 to 27.9 in adult - Plan: Semaglutide-Weight Management (WEGOVY) 0.25 MG/0.5ML SOAJ Prediabetes - Plan: Semaglutide-Weight Management (WEGOVY) 0.25 MG/0.5ML SOAJ  -Recent development of prediabetes based on most recent A1c.  Difficult with weight loss in spite of exercise, diet changes, potentially perimenopausal symptoms as contributor.  We discussed different approaches including diet/exercise approach for prediabetes, option of metformin, and option of GLP for weight loss given that she has overweight and prediabetes as comorbidity.  No family history of multiple endocrine neoplasia, medullary thyroid cancer or personal history of pancreatitis.  We discussed potential risks versus benefits of these medications and she would like to proceed with GLP if that is covered.  Will initially try Wegovy low-dose, and if that is not covered can look at other options.  She did ask about Ozempic or Mounjaro and we discussed that those medications would be off label use strictly for weight loss,  but can look at options once we hear back from her insurance on coverage of above meds.  1 month follow-up, with recheck A1c in 3 to 6 months.  Fatigue, unspecified type  -Improved, likely multifactorial with recent surgery, depression and anhedonia, and potentially perimenopausal symptoms as well.  Palpitations have improved.  Does plan on follow-up with  cardiology with RTC precautions given.  Continue follow-up with OB/GYN as well as psychiatry to discuss medication changes or other testing.  Meds ordered this encounter  Medications   Semaglutide-Weight Management (WEGOVY) 0.25 MG/0.5ML SOAJ    Sig: Inject 0.25 mg into the skin once a week.    Dispense:  2 mL    Refill:  1   Patient Instructions  Thanks for coming in today.  Glad to hear the fatigue has improved but with some of the residual fatigue I agree with you that this could be multiple contributors.  Premenopausal symptoms certainly can affect fatigue, as well as depression and lack of interest or anhedonia.  Continue follow-up as planned with psychiatry to discuss if medication changes needed as well as GYN to discuss the perimenopausal symptoms.  If any new or worsening symptoms we certainly can repeat blood work but I think it is reasonable to hold off on that today.  See information below regarding prediabetes.  I think it is worth a trial to see if Reginal Lutes will be covered.  I have sent the low-dose to your pharmacy.  If they do not cover that medication with prior authorization, we can discuss other options if those would be covered like Ozempic or Mounjaro, but those would be off label use if using strictly for weight loss and prediabetes.  Follow-up in 1 month but we should hear something from the pharmacy soon and can change plan  if needed.  Take care!   Prediabetes: What to Know Prediabetes is when your blood sugar, also called glucose, is at a higher level than normal but not  high enough for you to be diagnosed with type 2 diabetes (type 2 diabetes mellitus). Having prediabetes puts you at risk for getting type 2 diabetes. By making some healthy changes, you may be able to prevent or delay getting type 2 diabetes. This is important because type 2 diabetes can lead to serious problems. Some of these include: Heart disease. Stroke. Blindness. Kidney disease. Depression. Poor  blood flow in the feet and legs. In very bad cases, this could lead to having a leg removed by surgery (amputation). What are the causes? The exact cause of prediabetes isn't known. It may result from insulin resistance. Insulin resistance happens when cells in the body don't respond properly to insulin that the body makes. This can cause too much sugar to build up in the blood. High blood sugar, also called hyperglycemia, can develop. What increases the risk? Having a family member with type 2 diabetes. Being older than 56 years of age. Having had a temporary form of diabetes during a pregnancy. This is called gestational diabetes. Having had polycystic ovary syndrome (PCOS). Being overweight or obese. Being inactive and not getting much exercise. Having a history of heart disease. This may include problems with cholesterol levels, high levels of blood fats, or high blood pressure. What are the signs or symptoms? You may have no symptoms. If you do have symptoms, they may include: Increased hunger. Increased thirst. Needing to pee more often. Changes in how you see, like blurry vision. Feeling tired. How is this diagnosed? Prediabetes can be diagnosed with blood tests that check your blood sugar. One or more of these tests may be done: A fasting blood glucose (FBG) test. You won't be allowed to eat (you will fast) for at least 8 hours before a blood sample is taken. An A1C blood test, also called a hemoglobin A1C test. This test shows information about blood sugar levels over the past 2?3 months. An oral glucose tolerance test (OGTT). This test measures your blood sugar at two points in time: After you haven't eaten for a while. This is your baseline level. Two hours after you drink a beverage that has sugar in it. You may be diagnosed with prediabetes if: Your FBG is 100?125 mg/dL (4.0-9.8 mmol/L). Your A1C level is 5.7?6.4% (39-46 mmol/mol). Your OGTT result is 140?199 mg/dL (1.1-91  mmol/L). These blood tests may need to be done again to be sure of the diagnosis. How is this treated? Treatment may include making changes to your diet and lifestyle. These changes can help lower your blood sugar and keep you from getting type 2 diabetes. In some cases, medicine may be given to help lower your risk. Follow these instructions at home: Eating and drinking  Eat and drink as told. Follow a healthy meal plan. This includes eating lean proteins, whole grains, legumes, fresh fruits and vegetables, low-fat dairy products, and healthy fats. Meet with an expert in healthy eating called a dietitian. This person can help create a healthy eating plan that's right for you. Lifestyle Do moderate-intensity exercise. Do this for at least 30 minutes a day on 5 or more days each week, or as told by your health care provider. A mix of activities may be best. Good choices include brisk walking, swimming, biking, and weight lifting. Try to lose weight if your provider says it's OK. Losing 5-7% of your body weight can help reverse insulin resistance. Do not drink alcohol if: Your provider tells you not to drink. You're pregnant, may be pregnant,  or plan to become pregnant. If you drink alcohol: Limit how much you have to: 0-1 drink a day if you're female. 0-2 drinks a day if you're female. Know how much alcohol is in your drink. In the [place] one drink is one 12 oz bottle of beer (355 mL), one 5 oz glass of wine (148 mL), or one 1 oz glass of hard liquor (44 mL). General instructions Take medicines only as told. You may be given medicines that help lower the risk of type 2 diabetes. Do not smoke, vape, or use nicotine or tobacco. Where to find more information American Diabetes Association: diabetes.org/about-diabetes/prediabetes Academy of Nutrition and Dietetics: eatright.org American Heart Association: Go to ThisJobs.cz. Click the search icon. Type "prediabetes" in the search box. Contact  a health care provider if: You have any of these symptoms: Increased hunger. Peeing more often than usual. Increased thirst. Feeling tired. Changes in how you see, like blurry vision. Feeling like you may throw up. Throwing up. Get help right away if: You have shortness of breath. You feel confused. This information is not intended to replace advice given to you by your health care provider. Make sure you discuss any questions you have with your health care provider. Document Revised: 08/26/2022 Document Reviewed: 08/26/2022 Elsevier Patient Education  2024 Elsevier Inc.    Signed,   Meredith Staggers, MD [facility], [facility] 03/08/23 12:40 PM

## 2023-03-12 ENCOUNTER — Ambulatory Visit (HOSPITAL_BASED_OUTPATIENT_CLINIC_OR_DEPARTMENT_OTHER): Payer: 59 | Admitting: Cardiology

## 2023-03-12 NOTE — Progress Notes (Deleted)
 Cardiology Office Note:    Date:  03/12/2023   ID:  Aimee Maldonado, DOB 05/01/1965, MRN [id number]  PCP:  Levora Reyes SAUNDERS, MD  Cardiologist:  None  Electrophysiologist:  None   Referring MD: Levora Reyes SAUNDERS, MD   No chief complaint on file.   History of Present Illness:    Aimee Maldonado is a 58 y.o. female with a hx of SVT, hypertension who presents for follow-up.  She was referred by Elsie Lunger, PA for evaluation of possible atrial fibrillation, initially seen on 02/12/2020.  She reports she first started having issues with palpitations in her 59s.  She wore a monitor at that time but is unsure of the results.  Reports that during her second pregnancy at age 63 she was having issues with palpitations and tachycardia.  States that she was hospitalized at [facility] in [place]  and thinks that she was told she had atrial fibrillation (ADDENDUM: patient looked into this and said she was actually told it was SVT).  Reports palpitations resolved with delivery.    Echocardiogram on 03/08/2020 showed normal biventricular function, no significant valvular disease, possible small PFO.  Cardiac monitor was ordered but has not been done.  Since last clinic visit,  she reports that she has been doing well.  Reports palpitations occur rarely maybe about once per year not causing any distress.  Denies any chest pain, dyspnea, lightheadedness, syncope, lower extremity edema, or palpitations.   Past Medical History:  Diagnosis Date   Adenomyosis of uterus    Dysmenorrhea    Facial spasm    neurology-- j. mcdaniel NP ([facility])  facial spasm intermittant, affect the masseter muscle;   MRI in care everywhere with no potential cause of spasm per note and possible mental health   GAD (generalized anxiety disorder)    GERD (gastroesophageal reflux disease)    History of atrial fibrillation 2010   per pt told during second pregnancy age 16,  w/ palpitations and  tachycardia, hospitalized in [place] told had SVT when she looked back at her records,  stated palpitations resolved with delivery   History of supraventricular tachycardia 2010   cardiology in [place] evalution 02-12-2020 for elevated bp w/ dx HTN and palpitations,  echo done 03-08-2020 ef 65-70%, G1DD, no valvular issue, poss. PFO,  no ZIO monitor done pt refused when pt went back in her records found she had SVT not Afib episode when preg at 35 lov note 07-28-2020-- stated very rare palpitation,  pt did not f/u in 1 yr as recommended   Hypertension    Iron  deficiency anemia due to chronic blood loss    MDD (major depressive disorder)    Menorrhagia with irregular cycle    Symptomatic anemia    SOB,  dizzy,  lightedness, fatigue   Wears contact lenses     Past Surgical History:  Procedure Laterality Date   ABDOMINAL HYSTERECTOMY  02/15/2023   CESAREAN SECTION  05/2009   CYSTOSCOPY N/A 02/15/2023   Procedure: CYSTOSCOPY;  Surgeon: Glennon Almarie POUR, MD;  Location: [facility];  Service: Gynecology;  Laterality: N/A;   DILATION AND EVACUATION  2006   miscarriage   ROBOTIC ASSISTED LAPAROSCOPIC HYSTERECTOMY AND SALPINGECTOMY Bilateral 02/15/2023   Procedure: XI ROBOTIC ASSISTED LAPAROSCOPIC HYSTERECTOMY AND SALPINGECTOMY;  Surgeon: Glennon Almarie POUR, MD;  Location: [facility];  Service: Gynecology;  Laterality: Bilateral;    Current Medications: No outpatient medications have been marked as taking for  the 03/12/23 encounter (Appointment) with Kate Lonni CROME, MD.     Allergies:   Patient has no known allergies.   Social History   Socioeconomic History   Marital status: Married    Spouse name: Not on file   Number of children: Not on file   Years of education: Not on file   Highest education level: Not on file  Occupational History   Not on file  Tobacco Use   Smoking status: Never    Passive exposure: Never   Smokeless tobacco:  Never  Vaping Use   Vaping status: Never Used  Substance and Sexual Activity   Alcohol use: No   Drug use: Never   Sexual activity: Not Currently    Partners: Male    Birth control/protection: Surgical    Comment: menarche 58yo, sexual debut 58yo, hysterectomy  Other Topics Concern   Not on file  Social History Narrative   Not on file   Social Drivers of Health   Financial Resource Strain: Patient Declined (10/24/2022)   Received [facility]   Overall Financial Resource Strain (CARDIA)    Difficulty of Paying Living Expenses: Patient declined  Food Insecurity: Patient Declined (10/24/2022)   Received from [facility]   Hunger Vital Sign    Worried About Running Out of Food in the Last Year: Patient declined    Ran Out of Food in the Last Year: Patient declined  Transportation Needs: Patient Declined (10/24/2022)   Received from [facility] - Transportation    Lack of Transportation (Medical): Patient declined    Lack of Transportation (Non-Medical): Patient declined  Physical Activity: Not on file  Stress: Not on file  Social Connections: Unknown (09/28/2022)   Received from [employer]   Social Network    Social Network: Not on file     Family History: The patient's family history includes Anxiety disorder in her daughter, mother, and son; Cerebral palsy in her brother; Depression in her daughter; Diabetes in her father and mother; Hyperlipidemia in her brother; Hypertension in her mother; Miscarriages / Stillbirths in her mother.  ROS:   Please see the history of present illness.   (+)  All other systems reviewed and are negative.  EKGs/Labs/Other Studies Reviewed:    The following studies were reviewed today:   EKG:   02/12/2020: normal sinus rhythm, less than 1 mm ST depression in leads III, aVF, V5/6, rate 87 07/28/2020: normal sinus rhythm, less than 1 mm ST depression in leads II, III, aVF, V5/6, rate 80bpm, nospecific T wave  flattening  Recent Labs: 01/10/2023: TSH 1.15 02/05/2023: ALT 15 02/22/2023: BUN 13; Creatinine, Ser 0.73; Hemoglobin 11.9; Platelets 479.0; Potassium 4.1; Sodium 139  Recent Lipid Panel    Component Value Date/Time   CHOL 179 06/06/2022 1358   TRIG 249.0 (H) 06/06/2022 1358   HDL 44.40 06/06/2022 1358   CHOLHDL 4 06/06/2022 1358   VLDL 49.8 (H) 06/06/2022 1358   LDLDIRECT 125.0 06/06/2022 1358    Physical Exam:    VS:  LMP 12/22/2022 (Approximate)     Wt Readings from Last 3 Encounters:  03/08/23 142 lb 3.2 oz (64.5 kg)  02/22/23 143 lb 9.6 oz (65.1 kg)  02/15/23 145 lb 1.6 oz (65.8 kg)     GEN:  Well nourished, well developed in no acute distress HEENT: Normal NECK: No JVD; No carotid bruits LYMPHATICS: No lymphadenopathy CARDIAC: RRR, no murmurs, rubs, gallops RESPIRATORY:  Clear to auscultation without rales, wheezing or  rhonchi  ABDOMEN: Soft, non-tender, non-distended MUSCULOSKELETAL:  No edema; No deformity  SKIN: Warm and dry NEUROLOGIC:  Alert and oriented x 3 PSYCHIATRIC:  Normal affect   ASSESSMENT:    No diagnosis found.  PLAN:    Preop evaluation:***  SVT: initially reported history of Afib when pregnant in 2010-2011 but states that it was actually SVT.  Will obtain records from [facility].  Echocardiogram on 03/08/2020 showed normal biventricular function, no significant valvular disease, possible small PFO.  Zio patch was ordered but patient decided not to wear when she found out it was SVT she had not Afib -Palpitations appear well controlled, will continue Toprol -XL 25 mg daily.  Hypertension: Continue Toprol -XL 25 mg daily and hydrochlorothiazide  25 mg daily  Obesity: There is no height or weight on file to calculate BMI.  On Wegovy   RTC in 1 year***   Medication Adjustments/Labs and Tests Ordered: Current medicines are reviewed at length with the patient today.  Concerns regarding medicines are outlined above.  No orders of the  defined types were placed in this encounter.   No orders of the defined types were placed in this encounter.    There are no Patient Instructions on file for this visit.    Signed, Lonni LITTIE Nanas, MD  03/12/2023 5:30 AM    [facility]

## 2023-03-13 ENCOUNTER — Ambulatory Visit
Admission: RE | Admit: 2023-03-13 | Discharge: 2023-03-13 | Disposition: A | Discharge status: Home / Self Care | Payer: 59 | Source: Ambulatory Visit | Attending: Obstetrics and Gynecology | Admitting: Obstetrics and Gynecology

## 2023-03-13 DIAGNOSIS — E2839 Other primary ovarian failure: Secondary | ICD-10-CM

## 2023-03-18 ENCOUNTER — Encounter: Payer: Self-pay | Admitting: Obstetrics and Gynecology

## 2023-03-26 ENCOUNTER — Telehealth (HOSPITAL_COMMUNITY): Payer: Self-pay | Admitting: Pharmacy Technician

## 2023-03-26 ENCOUNTER — Other Ambulatory Visit (HOSPITAL_COMMUNITY): Payer: Self-pay

## 2023-03-26 NOTE — Telephone Encounter (Signed)
 Pharmacy Patient Advocate Encounter   Received notification from  [employer]  that prior authorization for [facility] 0.25MG /0.5ML PENS is required/requested.   Insurance verification completed.   The patient is insured through [facility] .   Per test claim: PA required; PA submitted to above mentioned insurance via CoverMyMeds Key/confirmation #/EOC [id number] Status is pending

## 2023-03-27 ENCOUNTER — Other Ambulatory Visit (HOSPITAL_COMMUNITY): Payer: Self-pay

## 2023-03-27 NOTE — Telephone Encounter (Signed)
 Call patient.  Unfortunately it appears that [facility] was not covered.  I suspect we will run into the same issue with zepbound.  As we discussed at her visit Ozempic and Greggory Keen would be off label use and likely not covered either but we can try sending if she would like.  Let me know.

## 2023-03-27 NOTE — Telephone Encounter (Signed)
 Pharmacy Patient Advocate Encounter  Received notification from [facility] MEDICAID that Prior Authorization for [facility] 0.25MG /0.5ML PEN has been DENIED.  Full denial letter will be uploaded to the media tab. See denial reason below.   PA #/Case ID/Reference #: [id number]  Medication excluded from coverage for weight loss.

## 2023-03-27 NOTE — Telephone Encounter (Signed)
 Patient has been informed and she is going to reach out to insurance and see what she can find out since she was told this would be covered with PA and then she will get back with

## 2023-04-03 ENCOUNTER — Other Ambulatory Visit (HOSPITAL_COMMUNITY): Payer: Self-pay

## 2023-04-05 LAB — HM MAMMOGRAPHY

## 2023-04-08 ENCOUNTER — Ambulatory Visit: Payer: 59 | Admitting: Family Medicine

## 2023-04-10 ENCOUNTER — Ambulatory Visit (INDEPENDENT_AMBULATORY_CARE_PROVIDER_SITE_OTHER): Admitting: Family Medicine

## 2023-04-10 ENCOUNTER — Encounter: Payer: Self-pay | Admitting: Family Medicine

## 2023-04-10 DIAGNOSIS — E663 Overweight: Secondary | ICD-10-CM | POA: Diagnosis not present

## 2023-04-10 DIAGNOSIS — R7303 Prediabetes: Secondary | ICD-10-CM

## 2023-04-10 DIAGNOSIS — I10 Essential (primary) hypertension: Secondary | ICD-10-CM | POA: Insufficient documentation

## 2023-04-10 DIAGNOSIS — Z6827 Body mass index (BMI) 27.0-27.9, adult: Secondary | ICD-10-CM | POA: Diagnosis not present

## 2023-04-10 MED ORDER — OZEMPIC (0.25 OR 0.5 MG/DOSE) 2 MG/3ML ~~LOC~~ SOPN: 0.2500 mg | PEN_INJECTOR | SUBCUTANEOUS | 0 refills | Status: DC

## 2023-04-10 NOTE — Progress Notes (Signed)
 Subjective:  Patient ID: Aimee Maldonado, female    DOB: 06/08/1965  Age: 58 y.o. MRN: [id number]  CC:  Chief Complaint  Patient presents with   Medical Management of Chronic Issues    Pt is doing okay   Weight Loss    Would like to revisit weight loss meds, note last tried med was denied    Health Maintenance    Patient will get Shingles Vaccine during Nurse visit at a later time, will consider HEP C and HIV screening later, declined Influenza vaccine, declined having COVID vaccine in 2024 as well     HPI Aimee Maldonado presents for follow-up.    Fatigue Last seen January 31.  Discussed fatigue at that time but that was improving and plan for her to follow-up with her psychiatrist to discuss if medication changes needed as well as plan to follow-up with her gynecologist to discuss perimenopausal symptoms.  Fatigue thought to be multifactorial with recent surgery, depression and anhedonia and potentially her perimenopausal symptoms.  Prior palpitations had improved. Will be seeing GYN at end of month.  Keeping on same psychiatric meds for now. Does take sleeping pill at night (trazodone)- thinks this may be metabolized slowly and feeling residual side effects into the next day. She will discuss with psychiatry.    Overweight with prediabetes Discussed in January.  Difficulty with weight loss with diet, exercise, and still had gained weight.  She requested medication like Ozempic or Mounjaro at that time.  She checked with insurance and may have some coverage with weight loss meds but Wegovy and zepbound apparently were not covered.  Understandably she had concerns with possible progression to diabetes.  We discussed the risks, benefits and alternatives for GLP-1, GIP medications at last visit.  Reginal Lutes initially ordered as that has indication for weight loss.  Per station obtained, medication was excluded from coverage for weight loss.  Per last notes, patient plan to discuss with her  insurance first prior to reviewing alternatives. She did not call insurance. She can get Ozempic at [facility] for a more reasonable cost.  Plans to start walking program.  Wt Readings from Last 3 Encounters:  04/10/23 142 lb (64.4 kg)  03/08/23 142 lb 3.2 oz (64.5 kg)  02/22/23 143 lb 9.6 oz (65.1 kg)  Body mass index is 27.73 kg/m.  Lab Results  Component Value Date   HGBA1C 5.8 (H) 02/08/2023   Health maintenance  See above, plans shingles vaccination during nurse visit at a later appointment.  Also defers hep B and HIV screening, declined influenza vaccine History Patient Active Problem List   Diagnosis Date Noted   High blood pressure 04/10/2023   Abnormal uterine bleeding (AUB) 01/11/2023   MDD (major depressive disorder) 01/11/2023   GAD (generalized anxiety disorder) 01/11/2023   Hypertension 01/11/2023   Major depressive disorder, recurrent, moderate (HCC) 10/24/2022   SVT (supraventricular tachycardia) (HCC) 08/11/2020   Past Medical History:  Diagnosis Date   Adenomyosis of uterus    Dysmenorrhea    Facial spasm    neurology-- j. mcdaniel NP ([facility])  facial spasm intermittant, affect the masseter muscle;   MRI in care everywhere with no potential cause of spasm per note and possible mental health   GAD (generalized anxiety disorder)    GERD (gastroesophageal reflux disease)    History of atrial fibrillation 2010   per pt told during second pregnancy age 29,  w/ palpitations and tachycardia, hospitalized in [place] told had SVT  when she looked back at her records,  stated palpitations resolved with delivery   History of supraventricular tachycardia 2010   cardiology in [place] evalution 02-12-2020 for elevated bp w/ dx HTN and palpitations,  echo done 03-08-2020 ef 65-70%, G1DD, no valvular issue, poss. PFO,  no ZIO monitor done pt refused when pt went back in her records found she had SVT not Afib episode when preg at 10 lov note  07-28-2020-- stated very rare palpitation,  pt did not f/u in 1 yr as recommended   Hypertension    Iron deficiency anemia due to chronic blood loss    MDD (major depressive disorder)    Menorrhagia with irregular cycle    Symptomatic anemia    SOB,  dizzy,  lightedness, fatigue   Wears contact lenses    Past Surgical History:  Procedure Laterality Date   ABDOMINAL HYSTERECTOMY  02/15/2023   CESAREAN SECTION  05/2009   CYSTOSCOPY N/A 02/15/2023   Procedure: CYSTOSCOPY;  Surgeon: Earley Favor, MD;  Location: [facility];  Service: Gynecology;  Laterality: N/A;   DILATION AND EVACUATION  2006   miscarriage   ROBOTIC ASSISTED LAPAROSCOPIC HYSTERECTOMY AND SALPINGECTOMY Bilateral 02/15/2023   Procedure: XI ROBOTIC ASSISTED LAPAROSCOPIC HYSTERECTOMY AND SALPINGECTOMY;  Surgeon: Earley Favor, MD;  Location: [facility];  Service: Gynecology;  Laterality: Bilateral;   No Known Allergies Prior to Admission medications   Medication Sig Start Date End Date Taking? Authorizing Provider  ALPRAZolam Prudy Feeler) 0.25 MG tablet Take 1 tablet by mouth daily as needed.   Yes [provider]  AUVELITY 45-105 MG TBCR Take 1 tablet by mouth 2 (two) times daily. 05/17/22  Yes [provider]  calcium carbonate (TUMS - DOSED IN MG ELEMENTAL CALCIUM) 500 MG chewable tablet Chew 1 tablet by mouth as directed.   Yes [provider]  hydrochlorothiazide (HYDRODIURIL) 25 MG tablet Take 1 tablet (25 mg total) by mouth daily. Patient taking differently: Take 25 mg by mouth daily. 06/06/22  Yes Shade Flood, MD  ibuprofen (ADVIL) 200 MG tablet Take 400 mg by mouth every 6 (six) hours as needed.   Yes [provider]  metoCLOPramide (REGLAN) 10 MG tablet Take 1 tablet (10 mg total) by mouth every 8 (eight) hours as needed for nausea. 02/15/23  Yes Earley Favor, MD  metoprolol succinate (TOPROL-XL) 50 MG 24 hr tablet Take 1 tablet  (50 mg total) by mouth daily. Take with or immediately following a meal. Patient taking differently: Take 50 mg by mouth daily. Take with or immediately following a meal. 06/06/22  Yes Shade Flood, MD  naproxen (NAPROSYN) 500 MG tablet Take 1 tablet (500 mg total) by mouth 2 (two) times daily. Patient taking differently: Take 500 mg by mouth 2 (two) times daily as needed. 02/02/23  Yes Durwin Glaze, MD  ondansetron (ZOFRAN) 4 MG tablet Take 1 tablet (4 mg total) by mouth every 6 (six) hours. 02/02/23  Yes Durwin Glaze, MD  oxyCODONE (ROXICODONE) 5 MG immediate release tablet Take 1 tablet (5 mg total) by mouth every 4 (four) hours as needed for severe pain (pain score 7-10). 02/15/23  Yes Earley Favor, MD  traZODone (DESYREL) 50 MG tablet Take 1-2 tablets by mouth at bedtime as needed for sleep.   Yes [provider]   Social History   Socioeconomic History   Marital status: Married    Spouse name: Not on file   Number of  children: Not on file   Years of education: Not on file   Highest education level: Not on file  Occupational History   Not on file  Tobacco Use   Smoking status: Never    Passive exposure: Never   Smokeless tobacco: Never  Vaping Use   Vaping status: Never Used  Substance and Sexual Activity   Alcohol use: No   Drug use: Never   Sexual activity: Not Currently    Partners: Male    Birth control/protection: Surgical    Comment: menarche 58yo, sexual debut 58yo, hysterectomy  Other Topics Concern   Not on file  Social History Narrative   Not on file   Social Drivers of Health   Financial Resource Strain: Patient Declined (10/24/2022)   Received from [facility]   Overall Financial Resource Strain (CARDIA)    Difficulty of Paying Living Expenses: Patient declined  Food Insecurity: Patient Declined (10/24/2022)   [facility]   Hunger Vital Sign    Worried About Running Out of Food in the Last Year: Patient declined     Ran Out of Food in the Last Year: Patient declined  Transportation Needs: Patient Declined (10/24/2022)   Received [facility] - Transportation    Lack of Transportation (Medical): Patient declined    Lack of Transportation (Non-Medical): Patient declined  Physical Activity: Not on file  Stress: Not on file  Social Connections: Unknown (09/28/2022)   Received from [facility]   Social Network    Social Network: Not on file  Intimate Partner Violence: Unknown (09/28/2022)   Received from [facility]   HITS    Physically Hurt: Not on file    Insult or Talk Down To: Not on file    Threaten Physical Harm: Not on file    Scream or Curse: Not on file    Review of Systems   Objective:   Vitals:   04/10/23 1603  BP: 128/78  Pulse: 84  Temp: 98 F (36.7 C)  TempSrc: Temporal  SpO2: 97%  Weight: 142 lb (64.4 kg)  Height: 5' (1.524 m)     Physical Exam Constitutional:      General: She is not in acute distress.    Appearance: Normal appearance. She is well-developed.  HENT:     Head: Normocephalic and atraumatic.  Cardiovascular:     Rate and Rhythm: Normal rate.  Pulmonary:     Effort: Pulmonary effort is normal.  Neurological:     Mental Status: She is alert and oriented to person, place, and time.  Psychiatric:        Mood and Affect: Mood normal.        Assessment & Plan:  Aimee Maldonado is a 58 y.o. female . Overweight with body mass index (BMI) of 27 to 27.9 in adult  Prediabetes Overweight with comorbidity of prediabetes.  Unfortunately Reginal Lutes was not covered by her insurance.  She has researched Ozempic underlying out-of-pocket through [employer] may be reasonable.  We discussed the off label use of Ozempic as it is not specifically indicated for weight management but is semaglutide.  Will start low-dose at 0.25 mg weekly, with potential side effects and risk discussed, RTC precautions given.  Virtual visit follow-up in 1 month to evaluate  for dosage changes.  Additionally I provided her number for healthy weight and wellness which may be another option for her weight management goals.  Commended on starting walking program.  Follow-up with specialist as  planned including discussion of sleeping medication with psychiatry as that may be having some lingering effects contributing to daytime fatigue.    No orders of the defined types were placed in this encounter.  There are no Patient Instructions on file for this visit.    Signed,   Meredith Staggers, MD [facility], [facility] 04/10/23 4:09 PM

## 2023-04-10 NOTE — Patient Instructions (Addendum)
 Thank you for coming in today.  Walking should help with management of weight.  We can start Ozempic as discussed, low dose initially 0.25 mg once per week until we follow-up next month.  If tolerated at that time we can try increasing dose.  If any new side effects, let me know right away.  Here is another resource of a local group that I do recommend for weight management.  Typically you do not need a referral.  Take care.  [facility]  [phone]

## 2023-04-18 ENCOUNTER — Other Ambulatory Visit (HOSPITAL_COMMUNITY): Payer: Self-pay

## 2023-04-24 ENCOUNTER — Ambulatory Visit: Payer: 59 | Admitting: Diagnostic Neuroimaging

## 2023-04-24 ENCOUNTER — Encounter: Payer: Self-pay | Admitting: Diagnostic Neuroimaging

## 2023-04-24 VITALS — BP 161/80 | HR 74 | Ht 60.0 in | Wt 143.0 lb

## 2023-04-24 DIAGNOSIS — R413 Other amnesia: Secondary | ICD-10-CM | POA: Diagnosis not present

## 2023-04-24 DIAGNOSIS — R269 Unspecified abnormalities of gait and mobility: Secondary | ICD-10-CM

## 2023-04-24 DIAGNOSIS — R251 Tremor, unspecified: Secondary | ICD-10-CM | POA: Diagnosis not present

## 2023-04-24 NOTE — Patient Instructions (Signed)
-   check MRI brain, cervical spine - start exercise program for balance, mobility, agility

## 2023-04-24 NOTE — Progress Notes (Signed)
 [facility]  PATIENT: Aimee Maldonado DOB: 15-Sep-1965  REFERRING CLINICIAN: Shade Flood, MD HISTORY FROM: PATIENT  REASON FOR VISIT: NEW CONSULT   HISTORICAL  CHIEF COMPLAINT:  Chief Complaint  Patient presents with   Memory Loss    Rm 6 alone Pt is well, reports when she picks things up with her R hand it will tremor.  She noticed it about 2-3 yrs ago She also mentions she has trouble with word finding.     HISTORY OF PRESENT ILLNESS:   58 year old female here for evaluation of memory loss, tremors, clumsy feeling, muscle contraction sensation.  Symptoms started around 2022.  In September 2024 was noticing some muscle contraction feeling above her left ear in the scalp, and went to ENT for evaluation.  She had MRI of the brain which showed some nonspecific T2 hyperintensities.  Went to a neurologist for evaluation.  She requested second opinion for further evaluation.   REVIEW OF SYSTEMS: Full 14 system review of systems performed and negative with exception of: As per HPI.  ALLERGIES: No Known Allergies  HOME MEDICATIONS: Outpatient Medications Prior to Visit  Medication Sig Dispense Refill   ALPRAZolam (XANAX) 0.25 MG tablet Take 1 tablet by mouth daily as needed.     AUVELITY 45-105 MG TBCR Take 1 tablet by mouth 2 (two) times daily.     calcium carbonate (TUMS - DOSED IN MG ELEMENTAL CALCIUM) 500 MG chewable tablet Chew 1 tablet by mouth as directed.     hydrochlorothiazide (HYDRODIURIL) 25 MG tablet Take 1 tablet (25 mg total) by mouth daily. (Patient taking differently: Take 25 mg by mouth daily.) 90 tablet 1   ibuprofen (ADVIL) 200 MG tablet Take 400 mg by mouth every 6 (six) hours as needed.     metoprolol succinate (TOPROL-XL) 50 MG 24 hr tablet Take 1 tablet (50 mg total) by mouth daily. Take with or immediately following a meal. (Patient taking differently: Take 50 mg by mouth daily. Take with or immediately following a meal.) 90 tablet 1    Semaglutide,0.25 or 0.5MG /DOS, (OZEMPIC, 0.25 OR 0.5 MG/DOSE,) 2 MG/3ML SOPN Inject 0.25 mg into the skin once a week. 3 mL 0   traZODone (DESYREL) 50 MG tablet Take 1-2 tablets by mouth at bedtime as needed for sleep.     metoCLOPramide (REGLAN) 10 MG tablet Take 1 tablet (10 mg total) by mouth every 8 (eight) hours as needed for nausea. (Patient not taking: Reported on 04/24/2023) 8 tablet 0   naproxen (NAPROSYN) 500 MG tablet Take 1 tablet (500 mg total) by mouth 2 (two) times daily. (Patient not taking: Reported on 04/24/2023) 30 tablet 0   ondansetron (ZOFRAN) 4 MG tablet Take 1 tablet (4 mg total) by mouth every 6 (six) hours. (Patient not taking: Reported on 04/24/2023) 12 tablet 0   oxyCODONE (ROXICODONE) 5 MG immediate release tablet Take 1 tablet (5 mg total) by mouth every 4 (four) hours as needed for severe pain (pain score 7-10). (Patient not taking: Reported on 04/24/2023) 30 tablet 0   No facility-administered medications prior to visit.    PAST MEDICAL HISTORY: Past Medical History:  Diagnosis Date   Adenomyosis of uterus    Dysmenorrhea    Facial spasm    neurology-- j. mcdaniel NP ([facility] [place])  facial spasm intermittant, affect the masseter muscle;   MRI in care everywhere with no potential cause of spasm per note and possible mental health   GAD (generalized anxiety disorder)  GERD (gastroesophageal reflux disease)    History of atrial fibrillation 2010   per pt told during second pregnancy age 13,  w/ palpitations and tachycardia, hospitalized in [place] told had SVT when she looked back at her records,  stated palpitations resolved with delivery   History of supraventricular tachycardia 2010   cardiology in  evalution 02-12-2020 for elevated bp w/ dx HTN and palpitations,  echo done 03-08-2020 ef 65-70%, G1DD, no valvular issue, poss. PFO,  no ZIO monitor done pt refused when pt went back in her records found she had SVT not Afib  episode when preg at 68 lov note 07-28-2020-- stated very rare palpitation,  pt did not f/u in 1 yr as recommended   Hypertension    Iron deficiency anemia due to chronic blood loss    MDD (major depressive disorder)    Menorrhagia with irregular cycle    Symptomatic anemia    SOB,  dizzy,  lightedness, fatigue   Wears contact lenses     PAST SURGICAL HISTORY: Past Surgical History:  Procedure Laterality Date   ABDOMINAL HYSTERECTOMY  02/15/2023   CESAREAN SECTION  05/2009   CYSTOSCOPY N/A 02/15/2023   Procedure: CYSTOSCOPY;  Surgeon: Earley Favor, MD;  Location: [facility];  Service: Gynecology;  Laterality: N/A;   DILATION AND EVACUATION  2006   miscarriage   ROBOTIC ASSISTED LAPAROSCOPIC HYSTERECTOMY AND SALPINGECTOMY Bilateral 02/15/2023   Procedure: XI ROBOTIC ASSISTED LAPAROSCOPIC HYSTERECTOMY AND SALPINGECTOMY;  Surgeon: Earley Favor, MD;  Location: [facility];  Service: Gynecology;  Laterality: Bilateral;    FAMILY HISTORY: Family History  Problem Relation Age of Onset   Diabetes Mother    Hypertension Mother    Anxiety disorder Mother    Miscarriages / [place] Mother    Diabetes Father    Hyperlipidemia Brother    Cerebral palsy Brother    Anxiety disorder Daughter    Depression Daughter    Anxiety disorder Son     SOCIAL HISTORY: Social History   Socioeconomic History   Marital status: Married    Spouse name: Not on file   Number of children: Not on file   Years of education: Not on file   Highest education level: Not on file  Occupational History   Not on file  Tobacco Use   Smoking status: Never    Passive exposure: Never   Smokeless tobacco: Never  Vaping Use   Vaping status: Never Used  Substance and Sexual Activity   Alcohol use: No   Drug use: Never   Sexual activity: Not Currently    Partners: Male    Birth control/protection: Surgical    Comment: menarche 58yo, sexual debut 58yo,  hysterectomy  Other Topics Concern   Not on file  Social History Narrative   Not on file   Social Drivers of Health   Financial Resource Strain: Patient Declined (10/24/2022)   Received from [facility]   Overall Financial Resource Strain (CARDIA)    Difficulty of Paying Living Expenses: Patient declined  Food Insecurity: Patient Declined (10/24/2022)   Received from [facility]   Hunger Vital Sign    Worried About Running Out of Food in the Last Year: Patient declined    Ran Out of Food in the Last Year: Patient declined  Transportation Needs: Patient Declined (10/24/2022)   Received from [facility] - Transportation    Lack of Transportation (Medical): Patient declined    Lack of Transportation (  Non-Medical): Patient declined  Physical Activity: Not on file  Stress: Not on file  Social Connections: Unknown (09/28/2022)   Received from [facility]   Social Network    Social Network: Not on file  Intimate Partner Violence: Unknown (09/28/2022)   Received from [facility]   HITS    Physically Hurt: Not on file    Insult or Talk Down To: Not on file    Threaten Physical Harm: Not on file    Scream or Curse: Not on file     PHYSICAL EXAM  GENERAL EXAM/CONSTITUTIONAL: Vitals:  Vitals:   04/24/23 0924 04/24/23 0942  BP: (!) 176/101 (!) 161/80  Pulse: 78 74  Weight: 143 lb (64.9 kg)   Height: 5' (1.524 m)    Body mass index is 27.93 kg/m. Wt Readings from Last 3 Encounters:  04/24/23 143 lb (64.9 kg)  04/10/23 142 lb (64.4 kg)  03/08/23 142 lb 3.2 oz (64.5 kg)   Patient is in no distress; well developed, nourished and groomed; neck is supple  CARDIOVASCULAR: Examination of carotid arteries is normal; no carotid bruits Regular rate and rhythm, no murmurs Examination of peripheral vascular system by observation and palpation is normal  EYES: Ophthalmoscopic exam of optic discs and posterior segments is normal; no papilledema or hemorrhages No  results found.  MUSCULOSKELETAL: Gait, strength, tone, movements noted in Neurologic exam below  NEUROLOGIC: MENTAL STATUS:      No data to display            04/24/2023    9:31 AM  Montreal Cognitive Assessment   Visuospatial/ Executive (0/5) 5  Naming (0/3) 3  Attention: Read list of digits (0/2) 2  Attention: Read list of letters (0/1) 1  Attention: Serial 7 subtraction starting at 100 (0/3) 2  Language: Repeat phrase (0/2) 2  Language : Fluency (0/1) 0  Abstraction (0/2) 2  Delayed Recall (0/5) 2  Orientation (0/6) 6  Total 25    awake, alert, oriented to person, place and time recent and remote memory intact normal attention and concentration language fluent, comprehension intact, naming intact fund of knowledge appropriate  CRANIAL NERVE:  2nd - no papilledema on fundoscopic exam 2nd, 3rd, 4th, 6th - pupils equal and reactive to light, visual fields full to confrontation, extraocular muscles intact, no nystagmus 5th - facial sensation symmetric 7th - facial strength symmetric 8th - hearing intact 9th - palate elevates symmetrically, uvula midline 11th - shoulder shrug symmetric 12th - tongue protrusion midline  MOTOR:  normal bulk and tone, full strength in the BUE, BLE  SENSORY:  normal and symmetric to light touch, temperature, vibration  COORDINATION:  finger-nose-finger, fine finger movements normal  REFLEXES:  deep tendon reflexes TRACE and symmetric  GAIT/STATION:  narrow based gait     DIAGNOSTIC DATA (LABS, IMAGING, TESTING) - I reviewed patient records, labs, notes, testing and imaging myself where available.  Lab Results  Component Value Date   WBC 9.4 02/22/2023   HGB 11.9 (L) 02/22/2023   HCT 36.9 02/22/2023   MCV 89.3 02/22/2023   PLT 479.0 (H) 02/22/2023      Component Value Date/Time   NA 139 02/22/2023 1221   NA 138 03/09/2020 1037   K 4.1 02/22/2023 1221   CL 104 02/22/2023 1221   CO2 26 02/22/2023 1221   GLUCOSE  94 02/22/2023 1221   BUN 13 02/22/2023 1221   BUN 13 03/09/2020 1037   CREATININE 0.73 02/22/2023 1221   CALCIUM 8.9 02/22/2023  1221   PROT 6.6 02/05/2023 1724   ALBUMIN 3.5 02/05/2023 1724   AST 15 02/05/2023 1724   ALT 15 02/05/2023 1724   ALKPHOS 52 02/05/2023 1724   BILITOT 0.2 02/05/2023 1724   GFRNONAA >60 02/05/2023 1724   GFRAA 117 03/09/2020 1037   Lab Results  Component Value Date   CHOL 179 06/06/2022   HDL 44.40 06/06/2022   LDLDIRECT 125.0 06/06/2022   TRIG 249.0 (H) 06/06/2022   CHOLHDL 4 06/06/2022   Lab Results  Component Value Date   HGBA1C 5.8 (H) 02/08/2023   No results found for: "VITAMINB12" Lab Results  Component Value Date   TSH 1.15 01/10/2023    10/10/22 MRI brain [I reviewed images myself and agree with interpretation. Non-specific T2 hyperintensities. -VRP]  1.  No acute intracranial process.  2.  Multiple bilateral white matter lesions are identified. These lesions are abnormal but nonspecific, usually resulting from the benign/remote/incidental causes (e.g. prior trauma/inflammation/demyelination, or chronic ischemia associated with migraine/atherosclerosis/other vasculopathies). Perpendicular orientation to the lateral ventricles can be seen with demyelinating disease. Clinical correlation is suggested.  3. If further assessment for tinnitus or right facial spasm is desired, dedicated MRI of the IACs or skull base could be obtained.     ASSESSMENT AND PLAN  58 y.o. year old female here with:   Dx:  1. Memory change   2. Gait difficulty   3. Tremor      PLAN:  MUSCLE TENSION / CONTRACTION (right post-auricular region)  MRI BRAIN FINDINGS (likely mild chronic small vessel ischemic disease)  GAIT DIFF / TREMORS  MEMORY CHANGES  - check MRI brain, cervical spine (rule out demyelinating dz) - check B12 - start exercise program for balance, mobility, agility  Orders Placed This Encounter  Procedures   MR BRAIN W WO CONTRAST    MR CERVICAL SPINE W WO CONTRAST    No orders of the defined types were placed in this encounter.   No follow-ups on file.    Suanne Marker, MD 04/24/2023, 10:40 AM Certified in Neurology, Neurophysiology and Neuroimaging  [facility] [address] [place], [place] [address] [phone]

## 2023-04-25 ENCOUNTER — Encounter: Payer: 59 | Admitting: Obstetrics and Gynecology

## 2023-04-26 LAB — VITAMIN B12: Vitamin B-12: 701 pg/mL (ref 232–1245)

## 2023-04-26 NOTE — Progress Notes (Signed)
 Results are good, no major findings. Continue current plan. -VRP

## 2023-05-06 ENCOUNTER — Other Ambulatory Visit: Payer: Self-pay | Admitting: Family Medicine

## 2023-05-06 ENCOUNTER — Encounter: Admitting: Obstetrics and Gynecology

## 2023-05-06 ENCOUNTER — Ambulatory Visit (INDEPENDENT_AMBULATORY_CARE_PROVIDER_SITE_OTHER): Admitting: Obstetrics and Gynecology

## 2023-05-06 ENCOUNTER — Encounter: Payer: Self-pay | Admitting: Obstetrics and Gynecology

## 2023-05-06 VITALS — BP 144/90 | HR 81

## 2023-05-06 DIAGNOSIS — N393 Stress incontinence (female) (male): Secondary | ICD-10-CM

## 2023-05-06 DIAGNOSIS — I10 Essential (primary) hypertension: Secondary | ICD-10-CM

## 2023-05-06 DIAGNOSIS — Z09 Encounter for follow-up examination after completed treatment for conditions other than malignant neoplasm: Secondary | ICD-10-CM

## 2023-05-06 MED ORDER — INTRAROSA 6.5 MG VA INST: 1.0000 | VAGINAL_INSERT | Freq: Every evening | VAGINAL | 12 refills | Status: AC | PRN

## 2023-05-06 MED ORDER — ESTRADIOL 0.0375 MG/24HR TD PTTW: 1.0000 | MEDICATED_PATCH | TRANSDERMAL | 12 refills | Status: AC

## 2023-05-06 NOTE — Patient Instructions (Signed)
 Hi Rindy, Let me know if you cannot afford the intrarosa and I can send in estrogen cream to apply to the vagina 3 times a week to support healing and help with the bladder and vaginal dryness in the future. Dr. Karma Greaser

## 2023-05-06 NOTE — Progress Notes (Signed)
 Patient presents for 10 week postop from [facility], bilateral salpingectomy, cystoscopy. She is doing well. No fevers, VB, dysuria or severe abdominal pain. Reports hot flashes, fatigue, depression.  She would like to begin estrogen to help Also having more SUI symptoms and would like to be seen with urogyn  BP (!) 144/90   Pulse 81   LMP 12/22/2022 (Approximate)   SpO2 99%   SVE: sutures not seen.  Good support.  Slight tenderness to qtip palpation. Cuff intact and with good support. normal discharge, no bleeding. Atrophic mucosa, however. Encouraged to begin estrogen.   A/p PO from [facility] 10 weeks doing well Resume activities 2. Pelvic rest for additional 2 wks. RTC for final exam before intercourse. 3. Intrarosa sent in to support continue vaginal cuff healing and for the bladder and vaginal mucosa in the future. TO begin lose dose estrogen patch as well. 4. Consult with urogyn placed. Dr. Karma Greaser

## 2023-05-12 NOTE — Progress Notes (Deleted)
 Cardiology Office Note:    Date:  05/12/2023   ID:  Aimee Maldonado, DOB [birth year], MRN [id number]  PCP:  Shade Flood, MD  Cardiologist:  None  Electrophysiologist:  None   Referring MD: Shade Flood, MD   No chief complaint on file.   History of Present Illness:    Aimee Maldonado is a 58 y.o. female with a hx of atrial fibrillation, hypertension who presents for follow-up.  She was referred by Marcelline Mates, PA for evaluation of possible atrial fibrillation, initially seen on 02/12/2020.  She reports she first started having issues with palpitations in her 48s.  She wore a monitor at that time but is unsure of the results.  Reports that during her second pregnancy at age 89 she was having issues with palpitations and tachycardia.  States that she was hospitalized at [facility] in [place] and thinks that she was told she had atrial fibrillation (ADDENDUM: patient looked into this and said she was actually told it was SVT).  Reports palpitations resolved with delivery.  Recently has been having issues with high blood pressure and palpitations.  Was started on metoprolol with resolution of her palpitations.  Was also started on HCTZ 12.5 mg daily at urgent care, but reports she ran out of this medication 2 days ago.  States that BP was still running 140s to 160s over 90s to 100s on HCTZ 12.5 mg daily.  She reports the palpitations she was having occurred about twice per week, can last for hours.  Felt like heart was racing.  She denies any chest pain, dyspnea, or lower extremity edema.  She reports she has had some episodes of lightheadedness when her blood pressure is high, but denies any syncopal episodes.  No smoking history.  Family history includes mother has atrial fibrillation.  Echocardiogram on 03/08/2020 showed normal biventricular function, no significant valvular disease, possible small PFO.  Cardiac monitor was ordered but has not been done.  Since last clinic  visit,  she reports that she has been doing well.  Reports palpitations occur rarely maybe about once per year not causing any distress.  Denies any chest pain, dyspnea, lightheadedness, syncope, lower extremity edema, or palpitations.   Past Medical History:  Diagnosis Date   Adenomyosis of uterus    Dysmenorrhea    Facial spasm    neurology-- j. mcdaniel NP ([facility] [place])  facial spasm intermittant, affect the masseter muscle;   MRI in care everywhere with no potential cause of spasm per note and possible mental health   GAD (generalized anxiety disorder)    GERD (gastroesophageal reflux disease)    History of atrial fibrillation 2010   per pt told during second pregnancy age 49,  w/ palpitations and tachycardia, hospitalized in [place] told had SVT when she looked back at her records,  stated palpitations resolved with delivery   History of supraventricular tachycardia 2010   cardiology in [place] evalution 02-12-2020 for elevated bp w/ dx HTN and palpitations,  echo done 03-08-2020 ef 65-70%, G1DD, no valvular issue, poss. PFO,  no ZIO monitor done pt refused when pt went back in her records found she had SVT not Afib episode when preg at 80 lov note 07-28-2020-- stated very rare palpitation,  pt did not f/u in 1 yr as recommended   Hypertension    Iron deficiency anemia due to chronic blood loss    MDD (major depressive disorder)    Menorrhagia with irregular cycle  Symptomatic anemia    SOB,  dizzy,  lightedness, fatigue   Wears contact lenses     Past Surgical History:  Procedure Laterality Date   ABDOMINAL HYSTERECTOMY  02/15/2023   CESAREAN SECTION  05/2009   CYSTOSCOPY N/A 02/15/2023   Procedure: CYSTOSCOPY;  Surgeon: Earley Favor, MD;  Location: [facility];  Service: Gynecology;  Laterality: N/A;   DILATION AND EVACUATION  2006   miscarriage   ROBOTIC ASSISTED LAPAROSCOPIC HYSTERECTOMY AND SALPINGECTOMY Bilateral  02/15/2023   Procedure: XI ROBOTIC ASSISTED LAPAROSCOPIC HYSTERECTOMY AND SALPINGECTOMY;  Surgeon: Earley Favor, MD;  Location: [facility];  Service: Gynecology;  Laterality: Bilateral;    Current Medications: No outpatient medications have been marked as taking for the 05/14/23 encounter (Appointment) with Little Ishikawa, MD.     Allergies:   Patient has no known allergies.   Social History   Socioeconomic History   Marital status: Married    Spouse name: Not on file   Number of children: Not on file   Years of education: Not on file   Highest education level: Not on file  Occupational History   Not on file  Tobacco Use   Smoking status: Never    Passive exposure: Never   Smokeless tobacco: Never  Vaping Use   Vaping status: Never Used  Substance and Sexual Activity   Alcohol use: No   Drug use: Never   Sexual activity: Not Currently    Partners: Male    Birth control/protection: Surgical    Comment: menarche 58yo, sexual debut 58yo, hysterectomy  Other Topics Concern   Not on file  Social History Narrative   Not on file   Social Drivers of Health   Financial Resource Strain: Patient Declined (10/24/2022)   Received from [facility]   Overall Financial Resource Strain (CARDIA)    Difficulty of Paying Living Expenses: Patient declined  Food Insecurity: Patient Declined (10/24/2022)   Received from [facility]   Hunger Vital Sign    Worried About Running Out of Food in the Last Year: Patient declined    Ran Out of Food in the Last Year: Patient declined  Transportation Needs: Patient Declined (10/24/2022)   Received from [facility] - Transportation    Lack of Transportation (Medical): Patient declined    Lack of Transportation (Non-Medical): Patient declined  Physical Activity: Not on file  Stress: Not on file  Social Connections: Unknown (09/28/2022)   Received from [employer]   Social Network    Social Network:  Not on file     Family History: The patient's family history includes Anxiety disorder in her daughter, mother, and son; Cerebral palsy in her brother; Depression in her daughter; Diabetes in her father and mother; Hyperlipidemia in her brother; Hypertension in her mother; Miscarriages / [place] in her mother.  ROS:   Please see the history of present illness.   (+)  All other systems reviewed and are negative.  EKGs/Labs/Other Studies Reviewed:    The following studies were reviewed today:   EKG:   02/12/2020: normal sinus rhythm, less than 1 mm ST depression in leads III, aVF, V5/6, rate 87 07/28/2020: normal sinus rhythm, less than 1 mm ST depression in leads II, III, aVF, V5/6, rate 80bpm, nospecific T wave flattening  Recent Labs: 01/10/2023: TSH 1.15 02/05/2023: ALT 15 02/22/2023: BUN 13; Creatinine, Ser 0.73; Hemoglobin 11.9; Platelets 479.0; Potassium 4.1; Sodium 139  Recent Lipid Panel  Component Value Date/Time   CHOL 179 06/06/2022 1358   TRIG 249.0 (H) 06/06/2022 1358   HDL 44.40 06/06/2022 1358   CHOLHDL 4 06/06/2022 1358   VLDL 49.8 (H) 06/06/2022 1358   LDLDIRECT 125.0 06/06/2022 1358    Physical Exam:    VS:  LMP 12/22/2022 (Approximate)     Wt Readings from Last 3 Encounters:  04/24/23 143 lb (64.9 kg)  04/10/23 142 lb (64.4 kg)  03/08/23 142 lb 3.2 oz (64.5 kg)     GEN:  Well nourished, well developed in no acute distress HEENT: Normal NECK: No JVD; No carotid bruits LYMPHATICS: No lymphadenopathy CARDIAC: RRR, no murmurs, rubs, gallops RESPIRATORY:  Clear to auscultation without rales, wheezing or rhonchi  ABDOMEN: Soft, non-tender, non-distended MUSCULOSKELETAL:  No edema; No deformity  SKIN: Warm and dry NEUROLOGIC:  Alert and oriented x 3 PSYCHIATRIC:  Normal affect   ASSESSMENT:    No diagnosis found.  PLAN:    SVT: initially reported history of Afib when pregnant in 20/102011 but states that it was actually SVT.  Will obtain  records from [facility].  Echocardiogram on 03/08/2020 showed normal biventricular function, no significant valvular disease, possible small PFO.  Zio patch was ordered patient decided not to wear when she found out it was SVT she had not Afib -Palpitations appear well controlled, will continue Toprol-XL 25 mg daily.  Hypertension: Continue Toprol-XL 25 mg daily and hydrochlorothiazide 25 mg daily  Obesity: On Ozempic  RTC in 1 year***   Medication Adjustments/Labs and Tests Ordered: Current medicines are reviewed at length with the patient today.  Concerns regarding medicines are outlined above.  No orders of the defined types were placed in this encounter.   No orders of the defined types were placed in this encounter.    There are no Patient Instructions on file for this visit.    Signed, Little Ishikawa, MD  05/12/2023 2:56 PM    [facility]

## 2023-05-13 ENCOUNTER — Telehealth (INDEPENDENT_AMBULATORY_CARE_PROVIDER_SITE_OTHER): Admitting: Family Medicine

## 2023-05-13 ENCOUNTER — Encounter: Payer: Self-pay | Admitting: Family Medicine

## 2023-05-13 DIAGNOSIS — I10 Essential (primary) hypertension: Secondary | ICD-10-CM | POA: Diagnosis not present

## 2023-05-13 DIAGNOSIS — Z6827 Body mass index (BMI) 27.0-27.9, adult: Secondary | ICD-10-CM | POA: Diagnosis not present

## 2023-05-13 DIAGNOSIS — E663 Overweight: Secondary | ICD-10-CM

## 2023-05-13 DIAGNOSIS — R7303 Prediabetes: Secondary | ICD-10-CM

## 2023-05-13 MED ORDER — OZEMPIC (0.25 OR 0.5 MG/DOSE) 2 MG/3ML ~~LOC~~ SOPN: 0.5000 mg | PEN_INJECTOR | SUBCUTANEOUS | 1 refills | Status: DC

## 2023-05-13 MED ORDER — HYDROCHLOROTHIAZIDE 25 MG PO TABS: 25.0000 mg | ORAL_TABLET | Freq: Every day | ORAL | 1 refills | Status: DC

## 2023-05-13 NOTE — Progress Notes (Unsigned)
 Virtual Visit via Video Note  I connected with Aimee Maldonado on 05/13/23 at 4:20 PM by a video enabled telemedicine application and verified that I am speaking with the correct person using two identifiers.  Patient location:home, by self My location: office - [place].    I discussed the limitations, risks, security and privacy concerns of performing an evaluation and management service by telephone and the availability of in person appointments. I also discussed with the patient that there may be a patient responsible charge related to this service. The patient expressed understanding and agreed to proceed, consent obtained  Chief complaint:  Chief Complaint  Patient presents with   Weight Check    Pt is well thinks she has lost 5 lbs, no side effects, pt would like to discuss going up but wants to discuss     History of Present Illness: Aimee Maldonado is a 58 y.o. female  Overweight, prediabetes Initially discussed in January.  Difficulty with weight loss with diet, exercise and had gained weight with efforts.  Reginal Lutes was not covered through insurance, but she had requested Ozempic out-of-pocket Wegovy, zepbound was not covered but she did request Ozempic and is paying out-of-pocket Wegovy, zepbound not covered but she did request use of Ozempic low-dose to assist with weight loss.  We discussed risks of that medication versus benefits and that this should be using off label as it was not specifically indicated for weight management but was semaglutide.  Understanding was expressed.  Started low-dose of 0.25 mg weekly at her March 5 visit.  Also provided her number for healthy weight and wellness to help achieve weight management goals.  She was starting a walking program at last visit. Weight to 142 with a BMI 27.73 on 04/10/2023. Lab Results  Component Value Date   HGBA1C 5.8 (H) 02/08/2023   Since last visit she has been doing well. Has been losing weight. She is lost  approximately 5 pounds on her own scale and would like to try slight higher dose.  No side effects with medication, specifically no nausea, vomiting, neck swelling, abdominal pain.  1 episode of constipation - resolved.  Now also on estrogen patches for postmenopausal symptoms by GYN - mood is better, going well.  Not meeting with [facility].   Hypertension: Needs refill of hydrochlorothiazide.  Home readings variable. 158/90. Elevated when stressed. Normal in office on 3/5.  BP Readings from Last 3 Encounters:  05/06/23 (!) 144/90  04/24/23 (!) 161/80  04/10/23 128/78   Lab Results  Component Value Date   CREATININE 0.73 02/22/2023       Patient Active Problem List   Diagnosis Date Noted   High blood pressure 04/10/2023   Abnormal uterine bleeding (AUB) 01/11/2023   MDD (major depressive disorder) 01/11/2023   GAD (generalized anxiety disorder) 01/11/2023   Hypertension 01/11/2023   Major depressive disorder, recurrent, moderate (HCC) 10/24/2022   SVT (supraventricular tachycardia) (HCC) 08/11/2020   Past Medical History:  Diagnosis Date   Adenomyosis of uterus    Dysmenorrhea    Facial spasm    neurology-- j. mcdaniel NP ([facility])  facial spasm intermittant, affect the masseter muscle;   MRI in care everywhere with no potential cause of spasm per note and possible mental health   GAD (generalized anxiety disorder)    GERD (gastroesophageal reflux disease)    History of atrial fibrillation 2010   per pt told during second pregnancy age 55,  w/ palpitations and  tachycardia, hospitalized in [place] told had SVT when she looked back at her records,  stated palpitations resolved with delivery   History of supraventricular tachycardia 2010   cardiology in [place] evalution 02-12-2020 for elevated bp w/ dx HTN and palpitations,  echo done 03-08-2020 ef 65-70%, G1DD, no valvular issue, poss. PFO,  no ZIO monitor done pt refused  when pt went back in her records found she had SVT not Afib episode when preg at 62 lov note 07-28-2020-- stated very rare palpitation,  pt did not f/u in 1 yr as recommended   Hypertension    Iron deficiency anemia due to chronic blood loss    MDD (major depressive disorder)    Menorrhagia with irregular cycle    Symptomatic anemia    SOB,  dizzy,  lightedness, fatigue   Wears contact lenses    Past Surgical History:  Procedure Laterality Date   ABDOMINAL HYSTERECTOMY  02/15/2023   CESAREAN SECTION  05/2009   CYSTOSCOPY N/A 02/15/2023   Procedure: CYSTOSCOPY;  Surgeon: Earley Favor, MD;  Location: [facility];  Service: Gynecology;  Laterality: N/A;   DILATION AND EVACUATION  2006   miscarriage   ROBOTIC ASSISTED LAPAROSCOPIC HYSTERECTOMY AND SALPINGECTOMY Bilateral 02/15/2023   Procedure: XI ROBOTIC ASSISTED LAPAROSCOPIC HYSTERECTOMY AND SALPINGECTOMY;  Surgeon: Earley Favor, MD;  Location: [facility];  Service: Gynecology;  Laterality: Bilateral;   No Known Allergies Prior to Admission medications   Medication Sig Start Date End Date Taking? Authorizing Provider  ALPRAZolam Prudy Feeler) 0.25 MG tablet Take 1 tablet by mouth daily as needed.   Yes [provider]  AUVELITY 45-105 MG TBCR Take 1 tablet by mouth 2 (two) times daily. 05/17/22  Yes [provider]  calcium carbonate (TUMS - DOSED IN MG ELEMENTAL CALCIUM) 500 MG chewable tablet Chew 1 tablet by mouth as directed.   Yes [provider]  estradiol (DOTTI) 0.0375 MG/24HR Place 1 patch onto the skin 2 (two) times a week. 05/06/23  Yes Earley Favor, MD  hydrochlorothiazide (HYDRODIURIL) 25 MG tablet Take 1 tablet (25 mg total) by mouth daily. 06/06/22  Yes Shade Flood, MD  ibuprofen (ADVIL) 200 MG tablet Take 400 mg by mouth every 6 (six) hours as needed.   Yes [provider]  metoprolol succinate (TOPROL-XL) 50 MG 24 hr tablet TAKE 1 TABLET  BY MOUTH ONCE DAILY WITH OR IMMEDIATELY FOLLOWING A MEAL 05/06/23  Yes Shade Flood, MD  Prasterone (INTRAROSA) 6.5 MG INST Place 1 suppository vaginally at bedtime as needed. 05/06/23  Yes Earley Favor, MD  Semaglutide,0.25 or 0.5MG /DOS, (OZEMPIC, 0.25 OR 0.5 MG/DOSE,) 2 MG/3ML SOPN Inject 0.25 mg into the skin once a week. 04/10/23  Yes Shade Flood, MD  traZODone (DESYREL) 50 MG tablet Take 1-2 tablets by mouth at bedtime as needed for sleep.   Yes [provider]   Social History   Socioeconomic History   Marital status: Married    Spouse name: Not on file   Number of children: Not on file   Years of education: Not on file   Highest education level: Not on file  Occupational History   Not on file  Tobacco Use   Smoking status: Never    Passive exposure: Never   Smokeless tobacco: Never  Vaping Use   Vaping status: Never Used  Substance and Sexual Activity   Alcohol use: No   Drug use: Never   Sexual activity: Not  Currently    Partners: Male    Birth control/protection: Surgical    Comment: menarche 58yo, sexual debut 58yo, hysterectomy  Other Topics Concern   Not on file  Social History Narrative   Not on file   Social Drivers of Health   Financial Resource Strain: Patient Declined (10/24/2022)   Received from [facility]   Overall Financial Resource Strain (CARDIA)    Difficulty of Paying Living Expenses: Patient declined  Food Insecurity: Patient Declined (10/24/2022)   [facility] from [facility]   Hunger Vital Sign    Worried About Running Out of Food in the Last Year: Patient declined    Ran Out of Food in the Last Year: Patient declined  Transportation Needs: Patient Declined (10/24/2022)   Received from [facility] - Transportation    Lack of Transportation (Medical): Patient declined    Lack of Transportation (Non-Medical): Patient declined  Physical Activity: Not on file  Stress: Not on file  Social Connections: Unknown  (09/28/2022)   Received from [facility]   Social Network    Social Network: Not on file  Intimate Partner Violence: Unknown (09/28/2022)   Received from [facility]   HITS    Physically Hurt: Not on file    Insult or Talk Down To: Not on file    Threaten Physical Harm: Not on file    Scream or Curse: Not on file    Observations/Objective: There were no vitals filed for this visit. No toxic appearance on video, speaking full sentences without respiratory distress.  Euthymic mood, all questions answered with understanding plan expressed.  Assessment and Plan: Primary hypertension - Plan: hydrochlorothiazide (HYDRODIURIL) 25 MG tablet  - Variable home readings but controlled last in office assessment.  Advised to check blood pressure when rested, repeat if elevated on initial reading.  1 month follow-up in office and can recheck blood pressure at that time, continue HCTZ same dose for now.  Overweight with body mass index (BMI) of 27 to 27.9 in adult - Plan: Semaglutide,0.25 or 0.5MG /DOS, (OZEMPIC, 0.25 OR 0.5 MG/DOSE,) 2 MG/3ML SOPN Prediabetes - Plan: Semaglutide,0.25 or 0.5MG /DOS, (OZEMPIC, 0.25 OR 0.5 MG/DOSE,) 2 MG/3ML SOPN  - Some relief with low-dose of Ozempic, would like to try higher dose, 0.5 mg ordered.  She does plan to check into alternate options for cost including Wegovy or Zepbound bile.  Did discuss those would be preferred as they do have indication for weight loss.  We have discussed off label use of Ozempic as above.  Can discuss further at 1 month follow-up.   Follow Up Instructions: 1 month.   I discussed the assessment and treatment plan with the patient. The patient was provided an opportunity to ask questions and all were answered. The patient agreed with the plan and demonstrated an understanding of the instructions.   The patient was advised to call back or seek an in-person evaluation if the symptoms worsen or if the condition fails to improve as  anticipated.   Shade Flood, MD

## 2023-05-13 NOTE — Patient Instructions (Signed)
 Thanks for taking the call today.  Okay to try a 0.5 mg dose of Ozempic, but would consider looking into Wegovy or Zepbound if there are multidose vial options that are less expensive or similar costs as those do have indication for weight loss.  Let me know which you find, but we can also discuss it further next visit.  No change in blood pressure medication for now, we will recheck her blood pressure in office next time but it was normal at your last visit with me.  Let me know if there are questions and take care.

## 2023-05-14 ENCOUNTER — Encounter (HOSPITAL_BASED_OUTPATIENT_CLINIC_OR_DEPARTMENT_OTHER): Payer: Self-pay | Admitting: *Deleted

## 2023-05-14 ENCOUNTER — Encounter (HOSPITAL_BASED_OUTPATIENT_CLINIC_OR_DEPARTMENT_OTHER): Payer: Self-pay | Admitting: Cardiology

## 2023-05-14 ENCOUNTER — Ambulatory Visit (HOSPITAL_BASED_OUTPATIENT_CLINIC_OR_DEPARTMENT_OTHER): Admitting: Cardiology

## 2023-05-14 ENCOUNTER — Ambulatory Visit (HOSPITAL_BASED_OUTPATIENT_CLINIC_OR_DEPARTMENT_OTHER): Payer: 59 | Admitting: Cardiology

## 2023-05-14 VITALS — BP 144/84 | HR 98 | Ht 60.0 in | Wt 142.0 lb

## 2023-05-14 DIAGNOSIS — I471 Supraventricular tachycardia, unspecified: Secondary | ICD-10-CM

## 2023-05-14 DIAGNOSIS — R072 Precordial pain: Secondary | ICD-10-CM | POA: Diagnosis not present

## 2023-05-14 DIAGNOSIS — I10 Essential (primary) hypertension: Secondary | ICD-10-CM | POA: Diagnosis not present

## 2023-05-14 MED ORDER — METOPROLOL TARTRATE 100 MG PO TABS: ORAL_TABLET | ORAL | 0 refills | Status: DC

## 2023-05-14 NOTE — Progress Notes (Signed)
 Cardiology Office Note:    Date:  05/14/2023   ID:  Aimee Maldonado, DOB July 07, 1965, MRN [id number]  PCP:  Shade Flood, MD  Cardiologist:  None  Electrophysiologist:  None   Referring MD: Shade Flood, MD   Chief Complaint  Patient presents with   Chest Pain    History of Present Illness:    Aimee Maldonado is a 58 y.o. female with a hx of atrial fibrillation, hypertension who presents for follow-up.  She was referred by Marcelline Mates, PA for evaluation of possible atrial fibrillation, initially seen on 02/12/2020.  She reports she first started having issues with palpitations in her 52s.  She wore a monitor at that time but is unsure of the results.  Reports that during her second pregnancy at age 42 she was having issues with palpitations and tachycardia.  States that she was hospitalized at [facility] in [place] and thinks that she was told she had atrial fibrillation (ADDENDUM: patient looked into this and said she was actually told it was SVT).  Reports palpitations resolved with delivery.  Echocardiogram on 03/08/2020 showed normal biventricular function, no significant valvular disease, possible small PFO.  Cardiac monitor was ordered but has not been done.  Since last clinic visit, she was found to have significant anemia and had to undergo urgent hysterectomy in January.  She was having palpitations during this time that has resolved.  Does report she has been having chest pain that she describes as pressure in center of her chest, occurs couple times per week.  Has not noted relationship with exertion but seems to occur with stress.  Can last for hours.   Past Medical History:  Diagnosis Date   Adenomyosis of uterus    Dysmenorrhea    Facial spasm    neurology-- j. mcdaniel NP ([facility])  facial spasm intermittant, affect the masseter muscle;   MRI in care everywhere with no potential cause of spasm per note and possible mental  health   GAD (generalized anxiety disorder)    GERD (gastroesophageal reflux disease)    History of atrial fibrillation 2010   per pt told during second pregnancy age 11,  w/ palpitations and tachycardia, hospitalized in [place] told had SVT when she looked back at her records,  stated palpitations resolved with delivery   History of supraventricular tachycardia 2010   cardiology in [place] evalution 02-12-2020 for elevated bp w/ dx HTN and palpitations,  echo done 03-08-2020 ef 65-70%, G1DD, no valvular issue, poss. PFO,  no ZIO monitor done pt refused when pt went back in her records found she had SVT not Afib episode when preg at 65 lov note 07-28-2020-- stated very rare palpitation,  pt did not f/u in 1 yr as recommended   Hypertension    Iron deficiency anemia due to chronic blood loss    MDD (major depressive disorder)    Menorrhagia with irregular cycle    Symptomatic anemia    SOB,  dizzy,  lightedness, fatigue   Wears contact lenses     Past Surgical History:  Procedure Laterality Date   ABDOMINAL HYSTERECTOMY  02/15/2023   CESAREAN SECTION  05/2009   CYSTOSCOPY N/A 02/15/2023   Procedure: CYSTOSCOPY;  Surgeon: Earley Favor, MD;  Location: [facility];  Service: Gynecology;  Laterality: N/A;   DILATION AND EVACUATION  2006   miscarriage   ROBOTIC ASSISTED LAPAROSCOPIC HYSTERECTOMY AND SALPINGECTOMY Bilateral 02/15/2023   Procedure: XI ROBOTIC  ASSISTED LAPAROSCOPIC HYSTERECTOMY AND SALPINGECTOMY;  Surgeon: Earley Favor, MD;  Location: [facility];  Service: Gynecology;  Laterality: Bilateral;    Current Medications: Current Meds  Medication Sig   ALPRAZolam (XANAX) 0.25 MG tablet Take 1 tablet by mouth daily as needed.   AUVELITY 45-105 MG TBCR Take 1 tablet by mouth 2 (two) times daily.   calcium carbonate (TUMS - DOSED IN MG ELEMENTAL CALCIUM) 500 MG chewable tablet Chew 1 tablet by mouth as directed.   estradiol (DOTTI)  0.0375 MG/24HR Place 1 patch onto the skin 2 (two) times a week.   hydrochlorothiazide (HYDRODIURIL) 25 MG tablet Take 1 tablet (25 mg total) by mouth daily.   ibuprofen (ADVIL) 200 MG tablet Take 400 mg by mouth every 6 (six) hours as needed.   metoprolol succinate (TOPROL-XL) 50 MG 24 hr tablet TAKE 1 TABLET BY MOUTH ONCE DAILY WITH OR IMMEDIATELY FOLLOWING A MEAL   metoprolol tartrate (LOPRESSOR) 100 MG tablet TAKE ONE TABLET TWO HOURS PRIOR TO CARDIAC CT SCAN   Prasterone (INTRAROSA) 6.5 MG INST Place 1 suppository vaginally at bedtime as needed.   Semaglutide,0.25 or 0.5MG /DOS, (OZEMPIC, 0.25 OR 0.5 MG/DOSE,) 2 MG/3ML SOPN Inject 0.5 mg into the skin once a week.   traZODone (DESYREL) 50 MG tablet Take 1-2 tablets by mouth at bedtime as needed for sleep.     Allergies:   Patient has no known allergies.   Social History   Socioeconomic History   Marital status: Married    Spouse name: Not on file   Number of children: Not on file   Years of education: Not on file   Highest education level: Not on file  Occupational History   Not on file  Tobacco Use   Smoking status: Never    Passive exposure: Never   Smokeless tobacco: Never  Vaping Use   Vaping status: Never Used  Substance and Sexual Activity   Alcohol use: No   Drug use: Never   Sexual activity: Not Currently    Partners: Male    Birth control/protection: Surgical    Comment: menarche 58yo, sexual debut 58yo, hysterectomy  Other Topics Concern   Not on file  Social History Narrative   Not on file   Social Drivers of Health   Financial Resource Strain: Patient Declined (10/24/2022)   Received from [facility]   Overall Financial Resource Strain (CARDIA)    Difficulty of Paying Living Expenses: Patient declined  Food Insecurity: Patient Declined (10/24/2022)   Received from [facility]   Hunger Vital Sign    Worried About Running Out of Food in the Last Year: Patient declined    Ran Out of Food in the Last  Year: Patient declined  Transportation Needs: Patient Declined (10/24/2022)   Received from [facility] - [facility]    Lack of Transportation (Medical): Patient declined    Lack of Transportation (Non-Medical): Patient declined  Physical Activity: Not on file  Stress: Not on file  Social Connections: Unknown (09/28/2022)   Received from [employer]   Social Network    Social Network: Not on file     Family History: The patient's family history includes Anxiety disorder in her daughter, mother, and son; Cerebral palsy in her brother; Depression in her daughter; Diabetes in her father and mother; Hyperlipidemia in her brother; Hypertension in her mother; Miscarriages / [place] in her mother.  ROS:   Please see the history of present illness.   (+)  All other systems reviewed and are negative.  EKGs/Labs/Other Studies Reviewed:    The following studies were reviewed today:   EKG:   02/12/2020: normal sinus rhythm, less than 1 mm ST depression in leads III, aVF, V5/6, rate 87 07/28/2020: normal sinus rhythm, less than 1 mm ST depression in leads II, III, aVF, V5/6, rate 80bpm, nospecific T wave flattening  Recent Labs: 01/10/2023: TSH 1.15 02/05/2023: ALT 15 02/22/2023: BUN 13; Creatinine, Ser 0.73; Hemoglobin 11.9; Platelets 479.0; Potassium 4.1; Sodium 139  Recent Lipid Panel    Component Value Date/Time   CHOL 179 06/06/2022 1358   TRIG 249.0 (H) 06/06/2022 1358   HDL 44.40 06/06/2022 1358   CHOLHDL 4 06/06/2022 1358   VLDL 49.8 (H) 06/06/2022 1358   LDLDIRECT 125.0 06/06/2022 1358    Physical Exam:    VS:  BP (!) 144/84   Pulse 98   Ht 5' (1.524 m)   Wt 142 lb (64.4 kg)   LMP 12/22/2022 (Approximate)   SpO2 97%   BMI 27.73 kg/m     Wt Readings from Last 3 Encounters:  05/14/23 142 lb (64.4 kg)  04/24/23 143 lb (64.9 kg)  04/10/23 142 lb (64.4 kg)     GEN:  Well nourished, well developed in no acute distress HEENT: Normal NECK: No JVD;  No carotid bruits LYMPHATICS: No lymphadenopathy CARDIAC: RRR, no murmurs, rubs, gallops RESPIRATORY:  Clear to auscultation without rales, wheezing or rhonchi  ABDOMEN: Soft, non-tender, non-distended MUSCULOSKELETAL:  No edema; No deformity  SKIN: Warm and dry NEUROLOGIC:  Alert and oriented x 3 PSYCHIATRIC:  Normal affect   ASSESSMENT:    1. Precordial pain   2. SVT (supraventricular tachycardia) (HCC)   3. Essential hypertension     PLAN:    Chest pain: Description suggest possible angina, as describes chest pressure when under stress.  Recommend coronary CTA to evaluate for obstructive CAD.  Recommend holding home Toprol-XL and taking Lopressor 100 mg for study.  SVT: initially reported history of Afib when pregnant in 20/102011 but states that it was actually SVT.  Will obtain records from [facility].  Echocardiogram on 03/08/2020 showed normal biventricular function, no significant valvular disease, possible small PFO.  Zio patch was ordered patient decided not to wear when she found out it was SVT she had not Afib -Palpitations appear well controlled, will continue Toprol-XL 50 mg daily.  Hypertension: Continue Toprol-XL 50 mg daily and hydrochlorothiazide 25 mg daily.  BP elevated in clinic today but reports she has been off her HCTZ recently, had refilled but has not started taking yet.  Encouraged compliance with medications   RTC in 4 months   Medication Adjustments/Labs and Tests Ordered: Current medicines are reviewed at length with the patient today.  Concerns regarding medicines are outlined above.  Orders Placed This Encounter  Procedures   CT CORONARY MORPH W/CTA COR W/SCORE W/CA W/CM &/OR WO/CM   Basic metabolic panel with GFR   CBC   Lipid panel    Meds ordered this encounter  Medications   metoprolol tartrate (LOPRESSOR) 100 MG tablet    Sig: TAKE ONE TABLET TWO HOURS PRIOR TO CARDIAC CT SCAN    Dispense:  1 tablet    Refill:  0      Patient Instructions  Medication Instructions:  Your physician recommends that you continue on your current medications as directed. Please refer to the Current Medication list given to you today.  *If you need a refill on your  cardiac medications before your next appointment, please call your pharmacy*  Lab Work: Your physician recommends that you return for lab work one day in the next week or so for fasting Lipid Panel, BMP, & CBC   Testing/Procedures:   Your cardiac CT will be scheduled at one of the below locations:   [facility] [address] [place], [place] [address] [phone]  If scheduled at [facility], please arrive at the [facility] and Children's Entrance (Entrance C2) of [facility] 30 minutes prior to test start time. You can use the FREE valet parking offered at entrance C (encouraged to control the heart rate for the test)  Proceed to the [facility] Radiology Department (first floor) to check-in and test prep.  All radiology patients and guests should use entrance C2 at [facility], accessed from [facility], even though the hospital's physical address listed is [address].     Please follow these instructions carefully (unless otherwise directed):  An IV will be required for this test and Nitroglycerin will be given.   On the Night Before the Test: Be sure to Drink plenty of water. Do not consume any caffeinated/decaffeinated beverages or chocolate 12 hours prior to your test. Do not take any antihistamines 12 hours prior to your test.  On the Day of the Test: Drink plenty of water until 1 hour prior to the test. Do not eat any food 1 hour prior to test. Take metoprolol (Lopressor) 100mg  two hours prior to test. HOLD YOUR TOPROL XL DAY OF.  If you take Hydrochlorothiazide please HOLD on the morning of the test. Patients who wear a continuous glucose monitor MUST remove the device prior  to scanning. FEMALES- please wear underwire-free bra if available, avoid dresses & tight clothing      After the Test: Drink plenty of water. After receiving IV contrast, you may experience a mild flushed feeling. This is normal. On occasion, you may experience a mild rash up to 24 hours after the test. This is not dangerous. If this occurs, you can take Benadryl 25 mg, Zyrtec, Claritin, or Allegra and increase your fluid intake. (Patients taking Tikosyn should avoid Benadryl, and may take Zyrtec, Claritin, or Allegra) If you experience trouble breathing, this can be serious. If it is severe call 911 IMMEDIATELY. If it is mild, please call our office.  We will call to schedule your test 2-4 weeks out understanding that some insurance companies will need an authorization prior to the service being performed.   For more information and frequently asked questions, please visit our website : [email or web address]  For non-scheduling related questions, please contact the cardiac imaging nurse navigator should you have any questions/concerns: Cardiac Imaging Nurse Navigators Direct Office Dial: [phone]   For scheduling needs, including cancellations and rescheduling, please call [place], [phone].   Follow-Up: At [facility], you and your health needs are our priority.  As part of our continuing mission to provide you with exceptional heart care, our providers are all part of one team.  This team includes your primary Cardiologist (physician) and Advanced Practice Providers or APPs (Physician Assistants and Nurse Practitioners) who all work together to provide you with the care you need, when you need it.  Your next appointment:   4 MONTHS WITH DR. Bjorn Pippin   We recommend signing up for the patient portal called "MyChart".  Sign up information is provided on this After Visit Summary.  MyChart is used  to connect with patients for Virtual Visits (Telemedicine).   Patients are able to view lab/test results, encounter notes, upcoming appointments, etc.  Non-urgent messages can be sent to your provider as well.   To learn more about what you can do with MyChart, go to [email or web address].   Other Instructions PLEASE CHECK YOUR BLOOD PRESSURE ONCE PER DAY 1-2 HOURS AFTER MEDICATION AND RESTING 5-10 MINUTES FOR THE NEXT TWO WEEKS AND THEN SEND [place] A MYCHART MESSAGE OR CALL IN THE BLOOD PRESSURES          Signed, Little Ishikawa, MD  05/14/2023 11:01 PM    [facility]

## 2023-05-14 NOTE — Patient Instructions (Signed)
 Medication Instructions:  Your physician recommends that you continue on your current medications as directed. Please refer to the Current Medication list given to you today.  *If you need a refill on your cardiac medications before your next appointment, please call your pharmacy*  Lab Work: Your physician recommends that you return for lab work one day in the next week or so for fasting Lipid Panel, BMP, & CBC   Testing/Procedures:   Your cardiac CT will be scheduled at one of the below locations:   [facility] [address] [place], [place] [address] [phone]  If scheduled at [facility], please arrive at the [facility] and Children's Entrance (Entrance C2) of [facility] 30 minutes prior to test start time. You can use the FREE valet parking offered at entrance C (encouraged to control the heart rate for the test)  Proceed to the [facility] Radiology Department (first floor) to check-in and test prep.  All radiology patients and guests should use entrance C2 at [facility], accessed from [facility], even though the hospital's physical address listed is [address]     Please follow these instructions carefully (unless otherwise directed):  An IV will be required for this test and Nitroglycerin will be given.   On the Night Before the Test: Be sure to Drink plenty of water. Do not consume any caffeinated/decaffeinated beverages or chocolate 12 hours prior to your test. Do not take any antihistamines 12 hours prior to your test.  On the Day of the Test: Drink plenty of water until 1 hour prior to the test. Do not eat any food 1 hour prior to test. Take metoprolol (Lopressor) 100mg  two hours prior to test. HOLD YOUR TOPROL XL DAY OF.  If you take Hydrochlorothiazide please HOLD on the morning of the test. Patients who wear a continuous glucose monitor MUST remove the device prior to scanning. FEMALES- please  wear underwire-free bra if available, avoid dresses & tight clothing      After the Test: Drink plenty of water. After receiving IV contrast, you may experience a mild flushed feeling. This is normal. On occasion, you may experience a mild rash up to 24 hours after the test. This is not dangerous. If this occurs, you can take Benadryl 25 mg, Zyrtec, Claritin, or Allegra and increase your fluid intake. (Patients taking Tikosyn should avoid Benadryl, and may take Zyrtec, Claritin, or Allegra) If you experience trouble breathing, this can be serious. If it is severe call 911 IMMEDIATELY. If it is mild, please call our office.  We will call to schedule your test 2-4 weeks out understanding that some insurance companies will need an authorization prior to the service being performed.   For more information and frequently asked questions, please visit our website : [email or web address]  For non-scheduling related questions, please contact the cardiac imaging nurse navigator should you have any questions/concerns: Cardiac Imaging Nurse Navigators Direct Office Dial: [phone]   For scheduling needs, including cancellations and rescheduling, please call [place], [phone].   Follow-Up: At [facility], you and your health needs are our priority.  As part of our continuing mission to provide you with exceptional heart care, our providers are all part of one team.  This team includes your primary Cardiologist (physician) and Advanced Practice Providers or APPs (Physician Assistants and Nurse Practitioners) who all work together to provide you with the care you need, when you need it.  Your next appointment:  4 MONTHS WITH DR. Bjorn Pippin   We recommend signing up for the patient portal called "MyChart".  Sign up information is provided on this After Visit Summary.  MyChart is used to connect with patients for Virtual Visits (Telemedicine).  Patients are able to view lab/test  results, encounter notes, upcoming appointments, etc.  Non-urgent messages can be sent to your provider as well.   To learn more about what you can do with MyChart, go to [email or web address].   Other Instructions PLEASE CHECK YOUR BLOOD PRESSURE ONCE PER DAY 1-2 HOURS AFTER MEDICATION AND RESTING 5-10 MINUTES FOR THE NEXT TWO WEEKS AND THEN SEND [place] A MYCHART MESSAGE OR CALL IN THE BLOOD PRESSURES

## 2023-05-14 NOTE — Progress Notes (Signed)
 Patient scheduled on 5/8

## 2023-05-15 ENCOUNTER — Other Ambulatory Visit (HOSPITAL_COMMUNITY): Payer: Self-pay

## 2023-05-15 ENCOUNTER — Ambulatory Visit

## 2023-05-15 ENCOUNTER — Telehealth: Payer: Self-pay

## 2023-05-15 DIAGNOSIS — R269 Unspecified abnormalities of gait and mobility: Secondary | ICD-10-CM

## 2023-05-15 DIAGNOSIS — R251 Tremor, unspecified: Secondary | ICD-10-CM | POA: Diagnosis not present

## 2023-05-15 DIAGNOSIS — R413 Other amnesia: Secondary | ICD-10-CM | POA: Diagnosis not present

## 2023-05-15 DIAGNOSIS — R7303 Prediabetes: Secondary | ICD-10-CM

## 2023-05-15 DIAGNOSIS — E663 Overweight: Secondary | ICD-10-CM

## 2023-05-15 MED ORDER — GADOBENATE DIMEGLUMINE 529 MG/ML IV SOLN
12.0000 mL | Freq: Once | INTRAVENOUS | Status: AC | PRN
  Administered 2023-05-15: 12 mL via INTRAVENOUS

## 2023-05-15 NOTE — Telephone Encounter (Signed)
 Ozempic is approved exclusively as an adjunct to diet and exercise to improve glycemic control in adults with type 2 diabetes mellitus. A review of patient's medical chart reveals no documented diagnosis of type 2 diabetes or an elevated A1C. Therefore, she does not currently meet the criteria for prior authorization of this medication. If clinically appropriate, alternative options such as Delford Field, or Reginal Lutes may be considered for this patient.

## 2023-05-15 NOTE — Telephone Encounter (Signed)
 Do you wish to change to a different therapy?

## 2023-05-17 ENCOUNTER — Encounter: Payer: Self-pay | Admitting: Diagnostic Neuroimaging

## 2023-05-17 ENCOUNTER — Ambulatory Visit: Admitting: Obstetrics and Gynecology

## 2023-05-17 ENCOUNTER — Encounter: Payer: Self-pay | Admitting: Obstetrics and Gynecology

## 2023-05-17 VITALS — BP 116/64 | HR 81

## 2023-05-17 DIAGNOSIS — Z09 Encounter for follow-up examination after completed treatment for conditions other than malignant neoplasm: Secondary | ICD-10-CM

## 2023-05-17 NOTE — Telephone Encounter (Signed)
 She had been purchasing out of pocket. We had discussed some alternatives at last visit - she had planned on looking into cost of Wegovy or Zepbound. Let me know if either of those were an option from what she found and I can change Rx. Thanks.

## 2023-05-17 NOTE — Telephone Encounter (Signed)
 Pt is willing to try Zepbound injectable /tirzepatide  notes open to trying to get lower cost but if not option then she will continue to pay out of pocket

## 2023-05-17 NOTE — Progress Notes (Signed)
 Patient presents for 10 week postop from [facility], bilateral salpingectomy, cystoscopy. She is doing well. No fevers, VB, dysuria or severe abdominal pain.  BP 116/64   Pulse 81   LMP 12/22/2022 (Approximate)   SpO2 99%   Abdomen: incisions I/c/d, NT, ND Feels good at the 0.37 and intrarosa at bedtime  SVE: good support, attached no bleeeding. No sutures seen. Slight tenderness to qtip palpation  A/p PO from [facility] 10 weeks doing well Resume all other activities 2. Pelvic rest for 2 additional weeks.  Continue daily intrarosa 3. RTC with any concerns or with heavy bleeding, fevers or severe abdominal pain.  Dr. Karma Greaser

## 2023-05-21 ENCOUNTER — Other Ambulatory Visit (HOSPITAL_COMMUNITY): Payer: Self-pay

## 2023-05-21 ENCOUNTER — Telehealth: Payer: Self-pay

## 2023-05-21 MED ORDER — TIRZEPATIDE-WEIGHT MANAGEMENT 2.5 MG/0.5ML ~~LOC~~ SOLN: 2.5000 mg | SUBCUTANEOUS | 1 refills | Status: DC

## 2023-05-21 NOTE — Addendum Note (Signed)
 Addended by: Cameron Schwinn R on: 05/21/2023 08:04 AM   Modules accepted: Orders

## 2023-05-21 NOTE — Telephone Encounter (Addendum)
 I will send Zepbound. If this is not covered by insurance, then zepbound vials direct from [employer] may be less expensive. $349/month for low dose, then $499 for higher doses. We would need to order direct from [place]. I will order locally first to see if covered. Sent to [facility]. Thanks.

## 2023-05-21 NOTE — Telephone Encounter (Signed)
 Perfect. Thank you. Will look at it tomorrow as option to send.

## 2023-05-21 NOTE — Telephone Encounter (Signed)
 Copied from CRM [id number]. Topic: Clinical - Prescription Issue >> May 21, 2023  9:08 AM Aimee Maldonado wrote: Reason for CRM: Aimee Maldonado called in due to them not filling what was previously send and need a new prescription  for the injection pen [id number] zepbound. Direct call back number [phone]

## 2023-05-21 NOTE — Telephone Encounter (Signed)
 Pharmacy Patient Advocate Encounter   Received notification from Pt Calls Messages that prior authorization for ZEPBOUND is required/requested.   Insurance verification completed.   The patient is insured through [facility] .   Per test claim: Product/service not covered - Plan/Benefit exclusion

## 2023-05-21 NOTE — Telephone Encounter (Signed)
 Can you do a test claim on Zepbound to see?

## 2023-05-21 NOTE — Telephone Encounter (Signed)
 Per test claim: Product/service not covered - Plan/Benefit exclusion

## 2023-05-21 NOTE — Telephone Encounter (Signed)
 Not covered by insurance through local pharmacy I can print [employer] forms for other option.

## 2023-05-22 NOTE — Telephone Encounter (Signed)
 [facility] NPI: [id number] NCPDP: [id number]  Per the website you are supposed to send Rx to this pharmacy   I have placed a print out of the websites "how to" in your review folder does not appear there is a form any longer

## 2023-05-28 ENCOUNTER — Encounter (HOSPITAL_BASED_OUTPATIENT_CLINIC_OR_DEPARTMENT_OTHER): Payer: Self-pay

## 2023-05-30 ENCOUNTER — Encounter (HOSPITAL_COMMUNITY): Payer: Self-pay

## 2023-05-31 ENCOUNTER — Telehealth (HOSPITAL_COMMUNITY): Payer: Self-pay | Admitting: Emergency Medicine

## 2023-05-31 NOTE — Telephone Encounter (Signed)
 Reaching out to patient to offer assistance regarding upcoming cardiac imaging study; pt verbalizes understanding of appt date/time, parking situation and where to check in, pre-test NPO status and medications ordered, and verified current allergies; name and call back number provided for further questions should they arise Rockwell Alexandria RN Navigator Cardiac Imaging [facility] [phone] office [phone] cell

## 2023-06-03 ENCOUNTER — Telehealth: Payer: Self-pay

## 2023-06-03 ENCOUNTER — Ambulatory Visit (HOSPITAL_COMMUNITY)
Admission: RE | Admit: 2023-06-03 | Discharge: 2023-06-03 | Disposition: A | Discharge status: Home / Self Care | Source: Ambulatory Visit | Attending: Cardiology | Admitting: Cardiology

## 2023-06-03 DIAGNOSIS — R072 Precordial pain: Secondary | ICD-10-CM

## 2023-06-03 DIAGNOSIS — R079 Chest pain, unspecified: Secondary | ICD-10-CM | POA: Insufficient documentation

## 2023-06-03 DIAGNOSIS — I2510 Atherosclerotic heart disease of native coronary artery without angina pectoris: Secondary | ICD-10-CM | POA: Insufficient documentation

## 2023-06-03 DIAGNOSIS — R7303 Prediabetes: Secondary | ICD-10-CM

## 2023-06-03 DIAGNOSIS — Z6827 Body mass index (BMI) 27.0-27.9, adult: Secondary | ICD-10-CM

## 2023-06-03 MED ORDER — NITROGLYCERIN 0.4 MG SL SUBL
0.8000 mg | SUBLINGUAL_TABLET | Freq: Once | SUBLINGUAL | Status: AC
  Administered 2023-06-03: 0.8 mg via SUBLINGUAL

## 2023-06-03 MED ORDER — NITROGLYCERIN 0.4 MG SL SUBL
SUBLINGUAL_TABLET | SUBLINGUAL | Status: AC
  Filled 2023-06-03: qty 2

## 2023-06-03 MED ORDER — IOHEXOL 350 MG/ML SOLN
100.0000 mL | Freq: Once | INTRAVENOUS | Status: AC | PRN
  Administered 2023-06-03: 100 mL via INTRAVENOUS

## 2023-06-03 NOTE — Telephone Encounter (Signed)
 Copied from CRM [id number]. Topic: Clinical - Prescription Issue >> Jun 03, 2023  3:25 PM Juleen [place] F wrote: Reason for CRM: Patient called says the zepbound was sent to [facility] pharmacy for the vile but pharmacy only has the pen. She requested a call back with an update bout the pen being sent. Patient wants to pick up from pharmacy today

## 2023-06-04 MED ORDER — ZEPBOUND 2.5 MG/0.5ML ~~LOC~~ SOAJ
2.5000 mg | SUBCUTANEOUS | 1 refills | Status: DC
Start: 2023-06-04 — End: 2023-06-13

## 2023-06-04 NOTE — Telephone Encounter (Signed)
 Okay to send pens? I will call the patient and inform her we will have to restart PA as the most recent was completed for vials not pens

## 2023-06-04 NOTE — Telephone Encounter (Signed)
 Sent in pens - please advise patient. Thanks.

## 2023-06-04 NOTE — Telephone Encounter (Signed)
 Pt has been informed.

## 2023-06-13 ENCOUNTER — Encounter: Payer: Self-pay | Admitting: Family Medicine

## 2023-06-13 ENCOUNTER — Other Ambulatory Visit: Payer: Self-pay | Admitting: *Deleted

## 2023-06-13 ENCOUNTER — Ambulatory Visit: Admitting: Family Medicine

## 2023-06-13 VITALS — BP 110/68 | HR 76 | Temp 98.0°F | Ht 60.0 in | Wt 137.8 lb

## 2023-06-13 DIAGNOSIS — E785 Hyperlipidemia, unspecified: Secondary | ICD-10-CM

## 2023-06-13 DIAGNOSIS — R7303 Prediabetes: Secondary | ICD-10-CM | POA: Diagnosis not present

## 2023-06-13 DIAGNOSIS — Z6826 Body mass index (BMI) 26.0-26.9, adult: Secondary | ICD-10-CM | POA: Diagnosis not present

## 2023-06-13 DIAGNOSIS — I10 Essential (primary) hypertension: Secondary | ICD-10-CM | POA: Diagnosis not present

## 2023-06-13 MED ORDER — ZEPBOUND 5 MG/0.5ML ~~LOC~~ SOLN: 5.0000 mg | SUBCUTANEOUS | 1 refills | Status: DC

## 2023-06-13 NOTE — Progress Notes (Signed)
 Subjective:  Patient ID: Aimee Maldonado, female    DOB: [birth year]  Age: 58 y.o. MRN: [id number]  CC:  Chief Complaint  Patient presents with   Hypertension    Pt has no questions     HPI [name] presents for   Hypertension: Slightly elevated at her April 7 visit.  144/90 at that time.  Home readings, plan for in office eval.  No changes made at that time. she was seen by cardiology on April 8.  Plan for CTA to evaluate for obstructive CAD.  History of SVT.  Controlled palpitations on Toprol -XL 50 mg daily.  Continued HCTZ 25 mg daily, elevated BP in clinic but had been off her HCT recently.  57-month follow-up. Coronary CT on 06/04/2023 with nonobstructive CAD with noncalcified plaque in proximal RCA causing minimal stenosis, 0 to 24%.  Coronary calcium score of 0 and normal coronary origin with right dominance. Plan for lipids with cardiology in few weeks.  Home readings: similar in office - 110/60-80.  No lightheadedness, dizziness or side effects with meds.  BP Readings from Last 3 Encounters:  06/13/23 110/68  06/03/23 120/75  05/17/23 116/64   Lab Results  Component Value Date   CREATININE 0.73 02/22/2023    Prediabetes with overweight. Treated with Ozempic  as of her April 7 visit, initially 0.25 mg then increase to 0.5 mg.  Off label use of Ozempic  discussed.  Weight has been improving, down to 137 today.  Denies new nausea, vomiting, abdominal pain or neck swelling.  1 episode of constipation was resolved at her April 7 visit.  We have tried switching to Zepbound  - tirzepatide . Has been on pens - 0.25mg . 2nd dose at home yesterday - 2 pens at home left.  No n/v/abd pain/constipation/neck swelling.   Home weights about 135 - few pound weight loss on tirzepatide . Feels like a little slower than ozempic  on weight loss- would like to try higher dose after next 2 weeks.   Feels better on estrogen/progesterone.  Had repeat MRI brain - mild chronic small vessel  disease. Followed by neuro.   Body mass index is 26.91 kg/m.  Lab Results  Component Value Date   HGBA1C 5.8 (H) 02/08/2023   Wt Readings from Last 3 Encounters:  06/13/23 137 lb 12.8 oz (62.5 kg)  05/14/23 142 lb (64.4 kg)  04/24/23 143 lb (64.9 kg)    History Patient Active Problem List   Diagnosis Date Noted   High blood pressure 04/10/2023   Abnormal uterine bleeding (AUB) 01/11/2023   MDD (major depressive disorder) 01/11/2023   GAD (generalized anxiety disorder) 01/11/2023   Hypertension 01/11/2023   Major depressive disorder, recurrent, moderate (HCC) 10/24/2022   SVT (supraventricular tachycardia) (HCC) 08/11/2020   Past Medical History:  Diagnosis Date   Adenomyosis of uterus    Closed traumatic dislocation of finger of right hand 05/12/2021   Dysmenorrhea    Facial spasm    neurology-- Vickki Grandchild NP ([facility])  facial spasm intermittant, affect the masseter muscle;   MRI in care everywhere with no potential cause of spasm per note and possible mental health   GAD (generalized anxiety disorder)    GERD (gastroesophageal reflux disease)    History of atrial fibrillation 2010   per pt told during second pregnancy age 31,  w/ palpitations and tachycardia, hospitalized in [place] told had SVT when she looked back at her records,  stated palpitations resolved with delivery   History of supraventricular tachycardia 2010  cardiology in [place] evalution 02-12-2020 for elevated bp w/ dx HTN and palpitations,  echo done 03-08-2020 ef 65-70%, G1DD, no valvular issue, poss. PFO,  no ZIO monitor done pt refused when pt went back in her records found she had SVT not Afib episode when preg at 57 lov note 07-28-2020-- stated very rare palpitation,  pt did not f/u in 1 yr as recommended   Hypertension    Iron  deficiency anemia due to chronic blood loss    MDD (major depressive disorder)    Menorrhagia with irregular cycle    Symptomatic anemia     SOB,  dizzy,  lightedness, fatigue   Wears contact lenses    Past Surgical History:  Procedure Laterality Date   ABDOMINAL HYSTERECTOMY  02/15/2023   CESAREAN SECTION  05/2009   CYSTOSCOPY N/A 02/15/2023   Procedure: CYSTOSCOPY;  Surgeon: Reinaldo Caras, MD;  Location: [facility];  Service: Gynecology;  Laterality: N/A;   DILATION AND EVACUATION  2006   miscarriage   ROBOTIC ASSISTED LAPAROSCOPIC HYSTERECTOMY AND SALPINGECTOMY Bilateral 02/15/2023   Procedure: XI ROBOTIC ASSISTED LAPAROSCOPIC HYSTERECTOMY AND SALPINGECTOMY;  Surgeon: Reinaldo Caras, MD;  Location: [facility];  Service: Gynecology;  Laterality: Bilateral;   No Known Allergies Prior to Admission medications   Medication Sig Start Date End Date Taking? Authorizing Provider  ALPRAZolam (XANAX) 0.25 MG tablet Take 1 tablet by mouth daily as needed.   Yes [provider]  AUVELITY 45-105 MG TBCR Take 1 tablet by mouth 2 (two) times daily. 05/17/22  Yes [provider]  calcium carbonate (TUMS - DOSED IN MG ELEMENTAL CALCIUM) 500 MG chewable tablet Chew 1 tablet by mouth as directed.   Yes [provider]  estradiol  (DOTTI ) 0.0375 MG/24HR Place 1 patch onto the skin 2 (two) times a week. 05/06/23  Yes Reinaldo Caras, MD  hydrochlorothiazide  (HYDRODIURIL ) 25 MG tablet Take 1 tablet (25 mg total) by mouth daily. 05/13/23  Yes Benjiman Bras, MD  ibuprofen  (ADVIL ) 200 MG tablet Take 400 mg by mouth every 6 (six) hours as needed.   Yes [provider]  metoprolol  succinate (TOPROL -XL) 50 MG 24 hr tablet TAKE 1 TABLET BY MOUTH ONCE DAILY WITH OR IMMEDIATELY FOLLOWING A MEAL 05/06/23  Yes Benjiman Bras, MD  metoprolol  tartrate (LOPRESSOR ) 100 MG tablet TAKE ONE TABLET TWO HOURS PRIOR TO CARDIAC CT SCAN 05/14/23  Yes Wendie Hamburg, MD  Prasterone  (INTRAROSA ) 6.5 MG INST Place 1 suppository vaginally at bedtime as needed. 05/06/23  Yes  Reinaldo Caras, MD  tirzepatide  (ZEPBOUND ) 2.5 MG/0.5ML Pen Inject 2.5 mg into the skin once a week. 06/04/23  Yes Benjiman Bras, MD  traZODone (DESYREL) 50 MG tablet Take 1-2 tablets by mouth at bedtime as needed for sleep.   Yes [provider]  UPNEEQ 0.1 % SOLN Apply 1 drop to eye daily. 04/08/23  Yes [provider]   Social History   Socioeconomic History   Marital status: Married    Spouse name: Not on file   Number of children: Not on file   Years of education: Not on file   Highest education level: Not on file  Occupational History   Not on file  Tobacco Use   Smoking status: Never    Passive exposure: Never   Smokeless tobacco: Never  Vaping Use   Vaping status: Never Used  Substance and Sexual Activity   Alcohol use: No   Drug use: Never  Sexual activity: Not Currently    Partners: Male    Birth control/protection: Surgical    Comment: menarche 58yo, sexual debut 58yo, hysterectomy  Other Topics Concern   Not on file  Social History Narrative   Not on file   Social Drivers of Health   Financial Resource Strain: Patient Declined (10/24/2022)   Received from [facility]   Overall Financial Resource Strain (CARDIA)    Difficulty of Paying Living Expenses: Patient declined  Food Insecurity: Patient Declined (10/24/2022)   Received from [facility]   Hunger Vital Sign    Worried About Running Out of Food in the Last Year: Patient declined    Ran Out of Food in the Last Year: Patient declined  Transportation Needs: Patient Declined (10/24/2022)   Received from [facility] - Transportation    Lack of Transportation (Medical): Patient declined    Lack of Transportation (Non-Medical): Patient declined  Physical Activity: Not on file  Stress: Not on file  Social Connections: Unknown (09/28/2022)   Received from [facility]   Social Network    Social Network: Not on file  Intimate Partner Violence: Unknown (09/28/2022)    Received from [facility]   HITS    Physically Hurt: Not on file    Insult or Talk Down To: Not on file    Threaten Physical Harm: Not on file    Scream or Curse: Not on file    Review of Systems  Constitutional:  Negative for fatigue and unexpected weight change.  Respiratory:  Negative for chest tightness and shortness of breath.   Cardiovascular:  Negative for chest pain, palpitations and leg swelling.  Gastrointestinal:  Negative for abdominal pain and blood in stool.  Neurological:  Negative for dizziness, syncope, light-headedness and headaches.     Objective:   Vitals:   06/13/23 1329  BP: 110/68  Pulse: 76  Temp: 98 F (36.7 C)  TempSrc: Temporal  SpO2: 98%  Weight: 137 lb 12.8 oz (62.5 kg)  Height: 5' (1.524 m)     Physical Exam Vitals reviewed.  Constitutional:      Appearance: Normal appearance. She is well-developed.  HENT:     Head: Normocephalic and atraumatic.  Eyes:     Conjunctiva/sclera: Conjunctivae normal.     Pupils: Pupils are equal, round, and reactive to light.  Neck:     Vascular: No carotid bruit.  Cardiovascular:     Rate and Rhythm: Normal rate and regular rhythm.     Heart sounds: Normal heart sounds.  Pulmonary:     Effort: Pulmonary effort is normal.     Breath sounds: Normal breath sounds.  Abdominal:     Palpations: Abdomen is soft. There is no pulsatile mass.     Tenderness: There is no abdominal tenderness.  Musculoskeletal:     Right lower leg: No edema.     Left lower leg: No edema.  Skin:    General: Skin is warm and dry.  Neurological:     Mental Status: She is alert and oriented to person, place, and time.  Psychiatric:        Mood and Affect: Mood normal.        Behavior: Behavior normal.        Assessment & Plan:  Maddilyn Heinsohn is a 58 y.o. female . Primary hypertension  BMI 26.0-26.9,adult - Plan: tirzepatide  (ZEPBOUND ) 5 MG/0.5ML injection vial  Prediabetes - Plan: tirzepatide  (ZEPBOUND ) 5  MG/0.5ML injection vial  Hypertension stable,  with cardiology eval as above.  Prior prediabetes, overweight, tolerating meds as above but will switch to injection vial to help with cost.  Should also like to try slight higher dose which I think is reasonable with close monitoring of side effects.  After the 2 weeks of the 0.25 mg can increase to 5 mg dose.  Sent. Potential side effects and risks of meds have been discussed with recheck in the next few months.  Meds ordered this encounter  Medications   tirzepatide  (ZEPBOUND ) 5 MG/0.5ML injection vial    Sig: Inject 5 mg into the skin once a week.    Dispense:  2 mL    Refill:  1   Patient Instructions  Zepbound  vials sent to the [facility].  They should be giving you a call.  Let me know if you do not hear from them.  For now continue the 2.5 mg pens for an additional 2 weeks, then can start the 5 mg vial, once per week.  Follow-up with me in 1 month for a virtual visit.  If any new side effects on the 5 mg, let me know right away as I would recommend returning to 2.5 mg.  Blood pressure looks good today.  Keep follow-up as planned with cardiology for labs, no labs from me today.  We can recheck blood sugar in the next few months.  Take care!    Signed,   Caro Christmas, MD [facility] 06/13/23 2:05 PM

## 2023-06-13 NOTE — Patient Instructions (Signed)
 Zepbound  vials sent to the [facility].  They should be giving you a call.  Let me know if you do not hear from them.  For now continue the 2.5 mg pens for an additional 2 weeks, then can start the 5 mg vial, once per week.  Follow-up with me in 1 month for a virtual visit.  If any new side effects on the 5 mg, let me know right away as I would recommend returning to 2.5 mg.  Blood pressure looks good today.  Keep follow-up as planned with cardiology for labs, no labs from me today.  We can recheck blood sugar in the next few months.  Take care!

## 2023-06-16 ENCOUNTER — Encounter: Payer: Self-pay | Admitting: Family Medicine

## 2023-07-15 ENCOUNTER — Telehealth (INDEPENDENT_AMBULATORY_CARE_PROVIDER_SITE_OTHER): Admitting: Family Medicine

## 2023-07-15 ENCOUNTER — Encounter: Payer: Self-pay | Admitting: Family Medicine

## 2023-07-15 VITALS — Wt 130.0 lb

## 2023-07-15 DIAGNOSIS — Z6826 Body mass index (BMI) 26.0-26.9, adult: Secondary | ICD-10-CM

## 2023-07-15 DIAGNOSIS — R7303 Prediabetes: Secondary | ICD-10-CM

## 2023-07-15 DIAGNOSIS — I10 Essential (primary) hypertension: Secondary | ICD-10-CM

## 2023-07-15 DIAGNOSIS — E663 Overweight: Secondary | ICD-10-CM

## 2023-07-15 MED ORDER — ZEPBOUND 5 MG/0.5ML ~~LOC~~ SOLN: 5.0000 mg | SUBCUTANEOUS | 1 refills | Status: DC

## 2023-07-15 NOTE — Patient Instructions (Signed)
 Thanks for taking the video call today.  Glad to hear the new medication is working well.  Keep an eye on the constipation side effects.  Some of my patients do have to add some fiber supplements like Citrucel or Metamucil to the diet if they are unable to achieve enough fiber through the diet, and MiraLAX can be used if needed for constipation.  I think we should stay on the same dose of meds for now and recheck in 2 months in person.  Can check some updated labs at that time so those will be available for your cardiology visit.  Let me know if there are questions.  Dr. Ester Helms

## 2023-07-15 NOTE — Progress Notes (Signed)
 Virtual Visit via Video Note  I connected with Aimee Maldonado on 07/15/23 at 1:45 PM by a video enabled telemedicine application and verified that I am speaking with the correct person using two identifiers.  Patient location: in car, car stopped. Mom in car, consent given to discuss PHI.  My location: office - [place].    I discussed the limitations, risks, security and privacy concerns of performing an evaluation and management service by telephone and the availability of in person appointments. I also discussed with the patient that there may be a patient responsible charge related to this service. The patient expressed understanding and agreed to proceed, consent obtained  Chief complaint:  Chief Complaint  Patient presents with   Hypertension    History of Present Illness: Aimee Maldonado is a 58 y.o. female  History of prediabetes, overweight. Office visit May 8.  Treated with Ozempic  as of April 7 visit, then changed to zepbound . Tolerating at last visit.  Discussed blood pressure at that time which had improved and was feeling better on estrogen and progesterone from GYN.  Changed to zepbound  injection vial at 5 mg dosing after she completed few more weeks of 0.25 mg.  Potential side effects have been discussed.  Has tried 2 doses of Zepbound . No new side effects. Feels like it is more effective than Ozempic . Better energy, losing weight faster. Some constipation at times.  No persistent abdominal pain. Not needing any meds to treat. Feels better with weight loss and current meds.  BP has been stable.   Last home weight 130 today.   Wt Readings from Last 3 Encounters:  07/15/23 130 lb (59 kg)  06/13/23 137 lb 12.8 oz (62.5 kg)  05/14/23 142 lb (64.4 kg)  Body mass index is 25.39 kg/m.   Patient Active Problem List   Diagnosis Date Noted   High blood pressure 04/10/2023   Abnormal uterine bleeding (AUB) 01/11/2023   MDD (major depressive disorder) 01/11/2023    GAD (generalized anxiety disorder) 01/11/2023   Hypertension 01/11/2023   Major depressive disorder, recurrent, moderate (HCC) 10/24/2022   SVT (supraventricular tachycardia) (HCC) 08/11/2020   Past Medical History:  Diagnosis Date   Adenomyosis of uterus    Closed traumatic dislocation of finger of right hand 05/12/2021   Dysmenorrhea    Facial spasm    neurology-- Vickki Grandchild NP ([facility])  facial spasm intermittant, affect the masseter muscle;   MRI in care everywhere with no potential cause of spasm per note and possible mental health   GAD (generalized anxiety disorder)    GERD (gastroesophageal reflux disease)    History of atrial fibrillation 2010   per pt told during second pregnancy age 52,  w/ palpitations and tachycardia, hospitalized in [place] told had SVT when she looked back at her records,  stated palpitations resolved with delivery   History of supraventricular tachycardia 2010   cardiology in [place] evalution 02-12-2020 for elevated bp w/ dx HTN and palpitations,  echo done 03-08-2020 ef 65-70%, G1DD, no valvular issue, poss. PFO,  no ZIO monitor done pt refused when pt went back in her records found she had SVT not Afib episode when preg at 75 lov note 07-28-2020-- stated very rare palpitation,  pt did not f/u in 1 yr as recommended   Hypertension    Iron  deficiency anemia due to chronic blood loss    MDD (major depressive disorder)    Menorrhagia with irregular cycle    Symptomatic anemia  SOB,  dizzy,  lightedness, fatigue   Wears contact lenses    Past Surgical History:  Procedure Laterality Date   ABDOMINAL HYSTERECTOMY  02/15/2023   CESAREAN SECTION  05/2009   CYSTOSCOPY N/A 02/15/2023   Procedure: CYSTOSCOPY;  Surgeon: Reinaldo Caras, MD;  [facility];  Service: Gynecology;  Laterality: N/A;   DILATION AND EVACUATION  2006   miscarriage   ROBOTIC ASSISTED LAPAROSCOPIC HYSTERECTOMY AND  SALPINGECTOMY Bilateral 02/15/2023   Procedure: XI ROBOTIC ASSISTED LAPAROSCOPIC HYSTERECTOMY AND SALPINGECTOMY;  Surgeon: Reinaldo Caras, MD;  Location: [facility];  Service: Gynecology;  Laterality: Bilateral;   No Known Allergies Prior to Admission medications   Medication Sig Start Date End Date Taking? Authorizing Provider  ALPRAZolam (XANAX) 0.25 MG tablet Take 1 tablet by mouth daily as needed.   Yes [provider]  AUVELITY 45-105 MG TBCR Take 1 tablet by mouth 2 (two) times daily. 05/17/22  Yes [provider]  calcium carbonate (TUMS - DOSED IN MG ELEMENTAL CALCIUM) 500 MG chewable tablet Chew 1 tablet by mouth as directed.   Yes [provider]  estradiol  (DOTTI ) 0.0375 MG/24HR Place 1 patch onto the skin 2 (two) times a week. 05/06/23  Yes Reinaldo Caras, MD  hydrochlorothiazide  (HYDRODIURIL ) 25 MG tablet Take 1 tablet (25 mg total) by mouth daily. 05/13/23  Yes Benjiman Bras, MD  ibuprofen  (ADVIL ) 200 MG tablet Take 400 mg by mouth every 6 (six) hours as needed.   Yes [provider]  metoprolol  succinate (TOPROL -XL) 50 MG 24 hr tablet TAKE 1 TABLET BY MOUTH ONCE DAILY WITH OR IMMEDIATELY FOLLOWING A MEAL 05/06/23  Yes Benjiman Bras, MD  tirzepatide  (ZEPBOUND ) 5 MG/0.5ML injection vial Inject 5 mg into the skin once a week. 06/13/23  Yes Benjiman Bras, MD  traZODone (DESYREL) 50 MG tablet Take 1-2 tablets by mouth at bedtime as needed for sleep.   Yes [provider]  UPNEEQ 0.1 % SOLN Apply 1 drop to eye daily. 04/08/23  Yes [provider]  Prasterone  (INTRAROSA ) 6.5 MG INST Place 1 suppository vaginally at bedtime as needed. Patient not taking: Reported on 07/15/2023 05/06/23   Reinaldo Caras, MD   Social History   Socioeconomic History   Marital status: Married    Spouse name: Not on file   Number of children: Not on file   Years of education: Not on file   Highest education level: Not  on file  Occupational History   Not on file  Tobacco Use   Smoking status: Never    Passive exposure: Never   Smokeless tobacco: Never  Vaping Use   Vaping status: Never Used  Substance and Sexual Activity   Alcohol use: No   Drug use: Never   Sexual activity: Not Currently    Partners: Male    Birth control/protection: Surgical    Comment: menarche 58yo, sexual debut 58yo, hysterectomy  Other Topics Concern   Not on file  Social History Narrative   Not on file   Social Drivers of Health   Financial Resource Strain: Patient Declined (10/24/2022)   Received from [facility]   Overall Financial Resource Strain (CARDIA)    Difficulty of Paying Living Expenses: Patient declined  Food Insecurity: Patient Declined (10/24/2022)   Received from [facility]   Hunger Vital Sign    Worried About Running Out of Food in the Last Year: Patient declined    Ran Out of  Food in the Last Year: Patient declined  Transportation Needs: Patient Declined (10/24/2022)   Received from [facility]   PRAPARE - Transportation    Lack of Transportation (Medical): Patient declined    Lack of Transportation (Non-Medical): Patient declined  Physical Activity: Not on file  Stress: Not on file  Social Connections: Unknown (09/28/2022)   Received from [facility]   Social Network    Social Network: Not on file  Intimate Partner Violence: Unknown (09/28/2022)   Received from [facility]   HITS    Physically Hurt: Not on file    Insult or Talk Down To: Not on file    Threaten Physical Harm: Not on file    Scream or Curse: Not on file    Observations/Objective: Vitals:   07/15/23 1309  Weight: 130 lb (59 kg)     Assessment and Plan: Prediabetes - Plan: tirzepatide  (ZEPBOUND ) 5 MG/0.5ML injection vial  Primary hypertension  Overweight  BMI 26.0-26.9,adult - Plan: tirzepatide  (ZEPBOUND ) 5 MG/0.5ML injection vial  Doing well with current regimen, will continue 5 mg zepbound .   Constipation is primary side effect but manageable.  Management options discussed with RTC precautions.  In office follow-up in 2 months.  Reports stable blood pressures and we will recheck in person at that visit along with labs prior to her cardiology visit.  RTC precautions given.   Follow Up Instructions: 2 months in person   I discussed the assessment and treatment plan with the patient. The patient was provided an opportunity to ask questions and all were answered. The patient agreed with the plan and demonstrated an understanding of the instructions.   The patient was advised to call back or seek an in-person evaluation if the symptoms worsen or if the condition fails to improve as anticipated.   Benjiman Bras, MD

## 2023-07-15 NOTE — Progress Notes (Signed)
 Per patient no change in vitals since last visit, unable to obtain new vitals due to telehealth visit.

## 2023-07-19 ENCOUNTER — Telehealth: Payer: Self-pay

## 2023-07-19 NOTE — Telephone Encounter (Signed)
 PA for Andreas [place] was approved. Patient notified.

## 2023-07-19 NOTE — Telephone Encounter (Signed)
 Prior authorization request received from covermymeds for [place] .  PA initiated.  Patient notified. KEY: [id number] N95.2 Hx SVT

## 2023-07-26 ENCOUNTER — Other Ambulatory Visit: Payer: Self-pay | Admitting: Family Medicine

## 2023-07-26 DIAGNOSIS — Z6826 Body mass index (BMI) 26.0-26.9, adult: Secondary | ICD-10-CM

## 2023-07-26 DIAGNOSIS — R7303 Prediabetes: Secondary | ICD-10-CM

## 2023-08-03 ENCOUNTER — Other Ambulatory Visit: Payer: Self-pay | Admitting: Family Medicine

## 2023-08-03 DIAGNOSIS — I10 Essential (primary) hypertension: Secondary | ICD-10-CM

## 2023-08-16 ENCOUNTER — Ambulatory Visit: Payer: 59 | Admitting: Radiology

## 2023-08-16 NOTE — Progress Notes (Deleted)
   Aimee Maldonado [birth year] [id number]   History:  59 y.o. G *** presents for annual exam.  Gynecologic History Hysterectomy: 02/2023  Sexually active: ***  Health Maintenance Last Pap: 08/14/22. Results were:  Last mammogram: 2/25. Results were:  Last colonoscopy: 10/22 cologuard negative      07/15/2023    1:11 PM 06/13/2023    1:31 PM 02/22/2023   11:12 AM  Depression screen PHQ 2/9  Decreased Interest 0 1 1  Down, Depressed, Hopeless 0 0 1  PHQ - 2 Score 0 1 2  Altered sleeping 0 2 1  Tired, decreased energy 0 1 1  Change in appetite 0 0 0  Feeling bad or failure about yourself  0 1 1  Trouble concentrating 0 0 0  Moving slowly or fidgety/restless 0 0 0  Suicidal thoughts 0 0 1  PHQ-9 Score 0 5 6  Difficult doing work/chores   Somewhat difficult     Past medical history, past surgical history, family history and social history were all reviewed and documented in the EPIC chart.  ROS:  A ROS was performed and pertinent positives and negatives are included.  Exam:  There were no vitals filed for this visit. There is no height or weight on file to calculate BMI.  General appearance:  Normal Thyroid :  Symmetrical, normal in size, without palpable masses or nodularity. Respiratory  Auscultation:  Clear without wheezing or rhonchi Cardiovascular  Auscultation:  Regular rate, without rubs, murmurs or gallops  Edema/varicosities:  Not grossly evident Abdominal  Soft,nontender, without masses, guarding or rebound.  Liver/spleen:  No organomegaly noted  Hernia:  None appreciated  Skin  Inspection:  Grossly normal Breasts: Examined lying and sitting.   Right: Without masses, retractions, nipple discharge or axillary adenopathy.   Left: Without masses, retractions, nipple discharge or axillary adenopathy. Genitourinary   Inguinal/mons:  Normal without inguinal adenopathy  External genitalia:  Normal appearing vulva with no masses, tenderness, or  lesions  BUS/Urethra/Skene's glands:  Normal  Vagina:  Normal appearing with normal color and discharge, no lesions. Atrophy ***  Cervix:  absent  Uterus:  absent  Adnexa/parametria:     Rt: Normal in size, without masses or tenderness.   Lt: Normal in size, without masses or tenderness.  Anus and perineum: Normal   Aimee Maldonado, CMA present for exam  Assessment/Plan:   There are no diagnoses linked to this encounter.    Discussed SBE, colonoscopy and DEXA screening as appropriate. Encouraged 155mins/week of cardiovascular and weight bearing exercise minimum. Recommend the use of seatbelts and sunscreen consistently.   Return in 1 year for annual or sooner prn.  Aimee Maldonado B WHNP-BC 12:46 PM 08/16/2023

## 2023-08-26 ENCOUNTER — Ambulatory Visit: Admitting: Obstetrics

## 2023-09-10 ENCOUNTER — Other Ambulatory Visit: Payer: Self-pay | Admitting: Family Medicine

## 2023-09-10 DIAGNOSIS — R7303 Prediabetes: Secondary | ICD-10-CM

## 2023-09-10 DIAGNOSIS — Z6826 Body mass index (BMI) 26.0-26.9, adult: Secondary | ICD-10-CM

## 2023-09-10 MED ORDER — ZEPBOUND 5 MG/0.5ML ~~LOC~~ SOLN
5.0000 mg | SUBCUTANEOUS | 1 refills | Status: DC
Start: 2023-09-10 — End: 2023-10-22

## 2023-09-10 NOTE — Telephone Encounter (Signed)
 Copied from CRM [id number]. Topic: Clinical - Medication Refill >> Sep 10, 2023 11:16 AM [place] M wrote: Medication: tirzepatide  (ZEPBOUND ) 5 MG/0.5ML injection vial  Has the patient contacted their pharmacy? Yes (Agent: If no, request that the patient contact the pharmacy for the refill. If patient does not wish to contact the pharmacy document the reason why and proceed with request.) (Agent: If yes, when and what did the pharmacy advise?)  This is the patient's preferred pharmacy:   [facility] [place], [place] - [phone] [place] [address] Phone: [phone] Fax: [phone]  Is this the correct pharmacy for this prescription? Yes If no, delete pharmacy and type the correct one.   Has the prescription been filled recently? Yes  Is the patient out of the medication? Yes  Has the patient been seen for an appointment in the last year OR does the patient have an upcoming appointment? Yes  Can we respond through MyChart? Yes  Agent: Please be advised that Rx refills may take up to 3 business days. We ask that you follow-up with your pharmacy.

## 2023-09-24 NOTE — Progress Notes (Unsigned)
 Cardiology Office Note:    Date:  09/25/2023   ID:  Aimee Maldonado, DOB 07/03/1965, MRN [id number]  PCP:  Levora Reyes SAUNDERS, MD  Cardiologist:  None  Electrophysiologist:  None   Referring MD: Levora Reyes SAUNDERS, MD   Chief Complaint  Patient presents with   SVT    History of Present Illness:    Aimee Maldonado is a 58 y.o. female with a hx of SVT, hypertension who presents for follow-up.  She was referred by Elsie Lunger, PA for evaluation of possible atrial fibrillation, initially seen on 02/12/2020.  She reports she first started having issues with palpitations in her 67s.  She wore a monitor at that time but is unsure of the results.  Reports that during her second pregnancy at age 49 she was having issues with palpitations and tachycardia.  States that she was hospitalized at [facility] in [place]  and thinks that she was told she had atrial fibrillation (ADDENDUM: patient looked into this and said she was actually told it was SVT).  Reports palpitations resolved with delivery.  Echocardiogram on 03/08/2020 showed normal biventricular function, no significant valvular disease, possible small PFO.  Cardiac monitor was ordered but has not been done.  She reported chest pain and underwent coronary CTA 06/03/2023 which showed nonobstructive CAD with noncalcified plaques in proximal RCA causing minimal stenosis, calcium score 0.  Since last clinic visit, she reports she is doing well.  She was started on Zepbound  and has lost 15 pounds.  She is walking 1 to 2 times a week for 20-30 minutes.  She denies any exertional symptoms.  She denies any lower extremity edema or palpitations.  Reports some lightheadedness related to hypoglycemia, denies any syncope.  Wt Readings from Last 3 Encounters:  09/25/23 131 lb 11.2 oz (59.7 kg)  07/15/23 130 lb (59 kg)  06/13/23 137 lb 12.8 oz (62.5 kg)      Past Medical History:  Diagnosis Date   Adenomyosis of uterus    Closed traumatic  dislocation of finger of right hand 05/12/2021   Dysmenorrhea    Facial spasm    neurology-- [place] minus NP ([facility] [place])  facial spasm intermittant, affect the masseter muscle;   MRI in care everywhere with no potential cause of spasm per note and possible mental health   GAD (generalized anxiety disorder)    GERD (gastroesophageal reflux disease)    History of atrial fibrillation 2010   per pt told during second pregnancy age 61,  w/ palpitations and tachycardia, hospitalized in [place] told had SVT when she looked back at her records,  stated palpitations resolved with delivery   History of supraventricular tachycardia 2010   cardiology in [place] evalution 02-12-2020 for elevated bp w/ dx HTN and palpitations,  echo done 03-08-2020 ef 65-70%, G1DD, no valvular issue, poss. PFO,  no ZIO monitor done pt refused when pt went back in her records found she had SVT not Afib episode when preg at 26 lov note 07-28-2020-- stated very rare palpitation,  pt did not f/u in 1 yr as recommended   Hypertension    Iron  deficiency anemia due to chronic blood loss    MDD (major depressive disorder)    Menorrhagia with irregular cycle    Symptomatic anemia    SOB,  dizzy,  lightedness, fatigue   Wears contact lenses     Past Surgical History:  Procedure Laterality Date   ABDOMINAL HYSTERECTOMY  02/15/2023   CESAREAN SECTION  05/2009   CYSTOSCOPY N/A 02/15/2023   Procedure: CYSTOSCOPY;  Surgeon: Glennon Almarie POUR, MD;  Location: [facility];  Service: Gynecology;  Laterality: N/A;   DILATION AND EVACUATION  2006   miscarriage   ROBOTIC ASSISTED LAPAROSCOPIC HYSTERECTOMY AND SALPINGECTOMY Bilateral 02/15/2023   Procedure: XI ROBOTIC ASSISTED LAPAROSCOPIC HYSTERECTOMY AND SALPINGECTOMY;  Surgeon: Glennon Almarie POUR, MD;  Location: [facility];  Service: Gynecology;  Laterality: Bilateral;    Current Medications: Current Meds  Medication  Sig   ALPRAZolam (XANAX) 0.25 MG tablet Take 1 tablet by mouth daily as needed.   AUVELITY 45-105 MG TBCR Take 1 tablet by mouth 2 (two) times daily.   calcium carbonate (TUMS - DOSED IN MG ELEMENTAL CALCIUM) 500 MG chewable tablet Chew 1 tablet by mouth as directed.   estradiol  (DOTTI ) 0.0375 MG/24HR Place 1 patch onto the skin 2 (two) times a week.   hydrochlorothiazide  (HYDRODIURIL ) 25 MG tablet Take 1 tablet (25 mg total) by mouth daily.   ibuprofen  (ADVIL ) 200 MG tablet Take 400 mg by mouth every 6 (six) hours as needed.   metoprolol  succinate (TOPROL -XL) 50 MG 24 hr tablet TAKE 1 TABLET BY MOUTH ONCE DAILY WITH  OR  IMMEDIATELY  FOLLOWING  A  MEAL   Prasterone  (INTRAROSA ) 6.5 MG INST Place 1 suppository vaginally at bedtime as needed.   tirzepatide  (ZEPBOUND ) 5 MG/0.5ML injection vial Inject 5 mg into the skin once a week.   traZODone (DESYREL) 50 MG tablet Take 1-2 tablets by mouth at bedtime as needed for sleep.   UPNEEQ 0.1 % SOLN Apply 1 drop to eye daily.     Allergies:   Patient has no known allergies.   Social History   Socioeconomic History   Marital status: Married    Spouse name: Not on file   Number of children: Not on file   Years of education: Not on file   Highest education level: Not on file  Occupational History   Not on file  Tobacco Use   Smoking status: Never    Passive exposure: Never   Smokeless tobacco: Never  Vaping Use   Vaping status: Never Used  Substance and Sexual Activity   Alcohol use: No   Drug use: Never   Sexual activity: Not Currently    Partners: Male    Birth control/protection: Surgical    Comment: menarche 58yo, sexual debut 58yo, hysterectomy  Other Topics Concern   Not on file  Social History Narrative   Not on file   Social Drivers of Health   Financial Resource Strain: Patient Declined (10/24/2022)   Received from [facility]   Overall Financial Resource Strain (CARDIA)    Difficulty of Paying Living Expenses: Patient  declined  Food Insecurity: Patient Declined (10/24/2022)   [facility]   Hunger Vital Sign    Within the past 12 months, you worried that your food would run out before you got the money to buy more.: Patient declined    Within the past 12 months, the food you bought just didn't last and you didn't have money to get more.: Patient declined  Transportation Needs: Patient Declined (10/24/2022)   Received from [facility] - Transportation    Lack of Transportation (Medical): Patient declined    Lack of Transportation (Non-Medical): Patient declined  Physical Activity: Not on file  Stress: Not on file  Social Connections: Unknown (09/28/2022)   [facility] from [facility]   Social Network  Social Network: Not on file     Family History: The patient's family history includes Anxiety disorder in her daughter, mother, and son; Cerebral palsy in her brother; Depression in her daughter; Diabetes in her father and mother; Hyperlipidemia in her brother; Hypertension in her mother; Miscarriages / [place] in her mother.  ROS:   Please see the history of present illness.    All other systems reviewed and are negative.  EKGs/Labs/Other Studies Reviewed:    The following studies were reviewed today:   EKG:   02/12/2020: normal sinus rhythm, less than 1 mm ST depression in leads III, aVF, V5/6, rate 87 07/28/2020: normal sinus rhythm, less than 1 mm ST depression in leads II, III, aVF, V5/6, rate 80bpm, nospecific T wave flattening  Recent Labs: 01/10/2023: TSH 1.15 02/05/2023: ALT 15 02/22/2023: BUN 13; Creatinine, Ser 0.73; Hemoglobin 11.9; Platelets 479.0; Potassium 4.1; Sodium 139  Recent Lipid Panel    Component Value Date/Time   CHOL 179 06/06/2022 1358   TRIG 249.0 (H) 06/06/2022 1358   HDL 44.40 06/06/2022 1358   CHOLHDL 4 06/06/2022 1358   VLDL 49.8 (H) 06/06/2022 1358   LDLDIRECT 125.0 06/06/2022 1358    Physical Exam:    VS:  BP 126/84   Pulse  75   Ht 5' (1.524 m)   Wt 131 lb 11.2 oz (59.7 kg)   LMP 12/22/2022 (Approximate)   SpO2 98%   BMI 25.72 kg/m     Wt Readings from Last 3 Encounters:  09/25/23 131 lb 11.2 oz (59.7 kg)  07/15/23 130 lb (59 kg)  06/13/23 137 lb 12.8 oz (62.5 kg)     GEN:  Well nourished, well developed in no acute distress HEENT: Normal NECK: No JVD; No carotid bruits LYMPHATICS: No lymphadenopathy CARDIAC: RRR, no murmurs, rubs, gallops RESPIRATORY:  Clear to auscultation without rales, wheezing or rhonchi  ABDOMEN: Soft, non-tender, non-distended MUSCULOSKELETAL:  No edema; No deformity  SKIN: Warm and dry NEUROLOGIC:  Alert and oriented x 3 PSYCHIATRIC:  Normal affect   ASSESSMENT:    1. Precordial pain   2. SVT (supraventricular tachycardia) (HCC)   3. Hyperlipidemia, unspecified hyperlipidemia type   4. Medication management      PLAN:    Chest pain: She reported chest pain and underwent coronary CTA 06/03/2023 which showed nonobstructive CAD with noncalcified plaques in proximal RCA causing minimal stenosis, calcium score 0. -Check lipid panel  SVT: initially reported history of Afib when pregnant in 2010/2011 but states that it was actually SVT.  Echocardiogram on 03/08/2020 showed normal biventricular function, no significant valvular disease, possible small PFO.  Zio patch was ordered patient decided not to wear when she found out it was SVT she had not Afib -Palpitations appear well controlled, will continue Toprol -XL 50 mg daily.  Hypertension: Continue Toprol -XL 50 mg daily and hydrochlorothiazide  25 mg daily.  Appears controlled  RTC in 1 year   Medication Adjustments/Labs and Tests Ordered: Current medicines are reviewed at length with the patient today.  Concerns regarding medicines are outlined above.  Orders Placed This Encounter  Procedures   Lipid panel    No orders of the defined types were placed in this encounter.    Patient Instructions  Medication  Instructions:  Your physician recommends that you continue on your current medications as directed. Please refer to the Current Medication list given to you today.  *If you need a refill on your cardiac medications before your next appointment, please call your pharmacy*  Lab Work: Fasting Lipid panel today  Testing/Procedures: NONE ordered at this time of appointment   Follow-Up: At [facility], you and your health needs are our priority.  As part of our continuing mission to provide you with exceptional heart care, our providers are all part of one team.  This team includes your primary Cardiologist (physician) and Advanced Practice Providers or APPs (Physician Assistants and Nurse Practitioners) who all work together to provide you with the care you need, when you need it.  Your next appointment:   1 year(s)  Provider:   Dr. Kate    We recommend signing up for the patient portal called MyChart.  Sign up information is provided on this After Visit Summary.  MyChart is used to connect with patients for Virtual Visits (Telemedicine).  Patients are able to view lab/test results, encounter notes, upcoming appointments, etc.  Non-urgent messages can be sent to your provider as well.   To learn more about what you can do with MyChart, go to [email or web address].           Signed, Lonni LITTIE Kate, MD  09/25/2023 2:23 PM    [facility]

## 2023-09-25 ENCOUNTER — Encounter: Payer: Self-pay | Admitting: Cardiology

## 2023-09-25 ENCOUNTER — Ambulatory Visit: Attending: Cardiology | Admitting: Cardiology

## 2023-09-25 VITALS — BP 126/84 | HR 75 | Ht 60.0 in | Wt 131.7 lb

## 2023-09-25 DIAGNOSIS — E785 Hyperlipidemia, unspecified: Secondary | ICD-10-CM

## 2023-09-25 DIAGNOSIS — Z79899 Other long term (current) drug therapy: Secondary | ICD-10-CM

## 2023-09-25 DIAGNOSIS — I471 Supraventricular tachycardia, unspecified: Secondary | ICD-10-CM | POA: Diagnosis not present

## 2023-09-25 DIAGNOSIS — R072 Precordial pain: Secondary | ICD-10-CM | POA: Diagnosis not present

## 2023-09-25 NOTE — Patient Instructions (Signed)
 Medication Instructions:  Your physician recommends that you continue on your current medications as directed. Please refer to the Current Medication list given to you today.  *If you need a refill on your cardiac medications before your next appointment, please call your pharmacy*  Lab Work: Fasting Lipid panel today  Testing/Procedures: NONE ordered at this time of appointment   Follow-Up: At [facility], you and your health needs are our priority.  As part of our continuing mission to provide you with exceptional heart care, our providers are all part of one team.  This team includes your primary Cardiologist (physician) and Advanced Practice Providers or APPs (Physician Assistants and Nurse Practitioners) who all work together to provide you with the care you need, when you need it.  Your next appointment:   1 year(s)  Provider:   Dr. Kate    We recommend signing up for the patient portal called MyChart.  Sign up information is provided on this After Visit Summary.  MyChart is used to connect with patients for Virtual Visits (Telemedicine).  Patients are able to view lab/test results, encounter notes, upcoming appointments, etc.  Non-urgent messages can be sent to your provider as well.   To learn more about what you can do with MyChart, go to [email or web address].

## 2023-09-26 ENCOUNTER — Ambulatory Visit: Payer: Self-pay | Admitting: Cardiology

## 2023-09-26 LAB — LIPID PANEL
Chol/HDL Ratio: 4.6 ratio — ABNORMAL HIGH (ref 0.0–4.4)
Cholesterol, Total: 178 mg/dL (ref 100–199)
HDL: 39 mg/dL — ABNORMAL LOW (ref >39)
LDL Chol Calc (NIH): 116 mg/dL — ABNORMAL HIGH (ref 0–99)
Triglycerides: 128 mg/dL (ref 0–149)
VLDL Cholesterol Cal: 23 mg/dL (ref 5–40)

## 2023-10-04 ENCOUNTER — Other Ambulatory Visit: Payer: Self-pay

## 2023-10-04 DIAGNOSIS — E785 Hyperlipidemia, unspecified: Secondary | ICD-10-CM

## 2023-10-04 MED ORDER — ROSUVASTATIN CALCIUM 10 MG PO TABS: 10.0000 mg | ORAL_TABLET | Freq: Every day | ORAL | 3 refills | Status: AC

## 2023-10-21 ENCOUNTER — Other Ambulatory Visit: Payer: Self-pay | Admitting: Family Medicine

## 2023-10-21 DIAGNOSIS — Z6826 Body mass index (BMI) 26.0-26.9, adult: Secondary | ICD-10-CM

## 2023-10-21 DIAGNOSIS — R7303 Prediabetes: Secondary | ICD-10-CM

## 2023-11-08 ENCOUNTER — Other Ambulatory Visit: Payer: Self-pay

## 2023-11-08 DIAGNOSIS — I10 Essential (primary) hypertension: Secondary | ICD-10-CM

## 2023-11-08 MED ORDER — METOPROLOL SUCCINATE ER 50 MG PO TB24: ORAL_TABLET | ORAL | 0 refills | Status: DC

## 2023-12-19 ENCOUNTER — Other Ambulatory Visit: Payer: Self-pay | Admitting: Family Medicine

## 2023-12-19 DIAGNOSIS — R7303 Prediabetes: Secondary | ICD-10-CM

## 2023-12-19 DIAGNOSIS — Z6826 Body mass index (BMI) 26.0-26.9, adult: Secondary | ICD-10-CM

## 2024-01-15 ENCOUNTER — Encounter: Payer: Self-pay | Admitting: Family Medicine

## 2024-01-15 ENCOUNTER — Ambulatory Visit: Admitting: Family Medicine

## 2024-01-15 VITALS — BP 104/72 | HR 83 | Temp 97.9°F | Resp 18 | Ht 60.0 in | Wt 121.0 lb

## 2024-01-15 DIAGNOSIS — I10 Essential (primary) hypertension: Secondary | ICD-10-CM

## 2024-01-15 DIAGNOSIS — Z6826 Body mass index (BMI) 26.0-26.9, adult: Secondary | ICD-10-CM | POA: Diagnosis not present

## 2024-01-15 DIAGNOSIS — Z1211 Encounter for screening for malignant neoplasm of colon: Secondary | ICD-10-CM | POA: Diagnosis not present

## 2024-01-15 DIAGNOSIS — R7303 Prediabetes: Secondary | ICD-10-CM

## 2024-01-15 DIAGNOSIS — E785 Hyperlipidemia, unspecified: Secondary | ICD-10-CM

## 2024-01-15 DIAGNOSIS — R002 Palpitations: Secondary | ICD-10-CM

## 2024-01-15 LAB — COMPREHENSIVE METABOLIC PANEL WITH GFR
ALT: 16 U/L (ref 0–35)
AST: 15 U/L (ref 0–37)
Albumin: 4.4 g/dL (ref 3.5–5.2)
Alkaline Phosphatase: 77 U/L (ref 39–117)
BUN: 16 mg/dL (ref 6–23)
CO2: 35 meq/L — ABNORMAL HIGH (ref 19–32)
Calcium: 9.5 mg/dL (ref 8.4–10.5)
Chloride: 96 meq/L (ref 96–112)
Creatinine, Ser: 0.85 mg/dL (ref 0.40–1.20)
GFR: 76.73 mL/min (ref >60.00)
Glucose, Bld: 78 mg/dL (ref 70–99)
Potassium: 3.2 meq/L — ABNORMAL LOW (ref 3.5–5.1)
Sodium: 138 meq/L (ref 135–145)
Total Bilirubin: 0.5 mg/dL (ref 0.2–1.2)
Total Protein: 6.8 g/dL (ref 6.0–8.3)

## 2024-01-15 LAB — LIPID PANEL
Cholesterol: 103 mg/dL (ref 0–200)
HDL: 41.2 mg/dL (ref >39.00)
LDL Cholesterol: 38 mg/dL (ref 0–99)
NonHDL: 61.84
Total CHOL/HDL Ratio: 3
Triglycerides: 118 mg/dL (ref 0.0–149.0)
VLDL: 23.6 mg/dL (ref 0.0–40.0)

## 2024-01-15 LAB — HEMOGLOBIN A1C: Hgb A1c MFr Bld: 5.3 % (ref 4.6–6.5)

## 2024-01-15 MED ORDER — HYDROCHLOROTHIAZIDE 25 MG PO TABS: 25.0000 mg | ORAL_TABLET | Freq: Every day | ORAL | 1 refills | Status: AC

## 2024-01-15 MED ORDER — ZEPBOUND 5 MG/0.5ML ~~LOC~~ SOLN: 5.0000 mg | SUBCUTANEOUS | 5 refills | Status: AC

## 2024-01-15 MED ORDER — METOPROLOL SUCCINATE ER 50 MG PO TB24: ORAL_TABLET | ORAL | 0 refills | Status: AC

## 2024-01-15 NOTE — Progress Notes (Unsigned)
 Subjective:  Patient ID: Aimee Maldonado, female    DOB: 03-May-1965  Age: 58 y.o. MRN: [id number]  CC:  Chief Complaint  Patient presents with   Hypertension    2 month follow up. No questions or concerns.     HPI Aimee Maldonado presents for   Hypertension: Followed by cardiology, office visit with Dr. Kate for precordial pain on August 20.  History of SVT, palpitations.  Zio patch has been ordered but when she had found out that previous arrhythmia was SVT, not A-fib, decided against monitor.  Palpitations were well-controlled at her August visit with cardiology and continued on Toprol -XL 50 mg daily. Additionally on hydrochlorothiazide  25 mg daily for hypertension, no new side effects with meds. No recent palpitations or chest pains.  1 year follow-up with cardiology. Feels well - mood is doing well, more energy.  Home readings: none.  BP Readings from Last 3 Encounters:  01/15/24 104/72  09/25/23 126/84  06/13/23 110/68   Lab Results  Component Value Date   CREATININE 0.73 02/22/2023   Hyperlipidemia: Crestor  10 mg daily started after her August 20 visit with cardiology.  Plan for repeat labs in 3 months.  Has not yet been rechecked. Taking crestor  daily - no new side effects.  Lab Results  Component Value Date   CHOL 178 09/25/2023   HDL 39 (L) 09/25/2023   LDLCALC 116 (H) 09/25/2023   LDLDIRECT 125.0 06/06/2022   TRIG 128 09/25/2023   CHOLHDL 4.6 (H) 09/25/2023   Lab Results  Component Value Date   ALT 15 02/05/2023   AST 15 02/05/2023   ALKPHOS 52 02/05/2023   BILITOT 0.2 02/05/2023    Obesity, prediabetes Discussed in June, had taken 2 doses of Zepbound  at that time that was tolerated better than Ozempic .  Had also been feeling better at that time with use of estrogen and progesterone from her gynecologist.  Intermittent constipation with meds but no other side effects.  Has not required any medications to treat constipation. Weight has continued to  improve, down to 121 today.  10 pounds loss from August. Sill on weekly zepbound  0.5mg . would like to stay at some dose. No meds needed for constipation. Hard stool.   Goal weight of 115.  Weight up to 145 in January.  Had difficulty losing weight including with watching diet and exercise at that time. Lab Results  Component Value Date   HGBA1C 5.8 (H) 02/08/2023  Body mass index is 23.63 kg/m.  Wt Readings from Last 3 Encounters:  01/15/24 121 lb (54.9 kg)  09/25/23 131 lb 11.2 oz (59.7 kg)  07/15/23 130 lb (59 kg)    HM: No family history of colon cancer, no personal history of bleeding or polyps.  Request Cologuard for colon cancer screening, will order today.  History Patient Active Problem List   Diagnosis Date Noted   High blood pressure 04/10/2023   Abnormal uterine bleeding (AUB) 01/11/2023   MDD (major depressive disorder) 01/11/2023   GAD (generalized anxiety disorder) 01/11/2023   Hypertension 01/11/2023   Major depressive disorder, recurrent, moderate (HCC) 10/24/2022   SVT (supraventricular tachycardia) 08/11/2020   Past Medical History:  Diagnosis Date   Adenomyosis of uterus    Closed traumatic dislocation of finger of right hand 05/12/2021   Dysmenorrhea    Facial spasm    neurology-- [place] minus NP ([facility])  facial spasm intermittant, affect the masseter muscle;   MRI in care everywhere with no potential cause of  spasm per note and possible mental health   GAD (generalized anxiety disorder)    GERD (gastroesophageal reflux disease)    History of atrial fibrillation 2010   per pt told during second pregnancy age 48,  w/ palpitations and tachycardia, hospitalized in [place] told had SVT when she looked back at her records,  stated palpitations resolved with delivery   History of supraventricular tachycardia 2010   cardiology in [place] evalution 02-12-2020 for elevated bp w/ dx HTN and palpitations,  echo done 03-08-2020 ef  65-70%, G1DD, no valvular issue, poss. PFO,  no ZIO monitor done pt refused when pt went back in her records found she had SVT not Afib episode when preg at 77 lov note 07-28-2020-- stated very rare palpitation,  pt did not f/u in 1 yr as recommended   Hypertension    Iron  deficiency anemia due to chronic blood loss    MDD (major depressive disorder)    Menorrhagia with irregular cycle    Symptomatic anemia    SOB,  dizzy,  lightedness, fatigue   Wears contact lenses    Past Surgical History:  Procedure Laterality Date   ABDOMINAL HYSTERECTOMY  02/15/2023   CESAREAN SECTION  05/2009   CYSTOSCOPY N/A 02/15/2023   Procedure: CYSTOSCOPY;  Surgeon: Glennon Almarie POUR, MD;  Location: [facility];  Service: Gynecology;  Laterality: N/A;   DILATION AND EVACUATION  2006   miscarriage   ROBOTIC ASSISTED LAPAROSCOPIC HYSTERECTOMY AND SALPINGECTOMY Bilateral 02/15/2023   Procedure: XI ROBOTIC ASSISTED LAPAROSCOPIC HYSTERECTOMY AND SALPINGECTOMY;  Surgeon: Glennon Almarie POUR, MD;  Location: [facility];  Service: Gynecology;  Laterality: Bilateral;   No Known Allergies Prior to Admission medications   Medication Sig Start Date End Date Taking? Authorizing Provider  ALPRAZolam (XANAX) 0.25 MG tablet Take 1 tablet by mouth daily as needed.   Yes [provider]  AUVELITY 45-105 MG TBCR Take 1 tablet by mouth 2 (two) times daily. 05/17/22  Yes [provider]  calcium  carbonate (TUMS - DOSED IN MG ELEMENTAL CALCIUM ) 500 MG chewable tablet Chew 1 tablet by mouth as directed.   Yes [provider]  estradiol  (DOTTI ) 0.0375 MG/24HR Place 1 patch onto the skin 2 (two) times a week. 05/06/23  Yes Glennon Almarie POUR, MD  hydrochlorothiazide  (HYDRODIURIL ) 25 MG tablet Take 1 tablet (25 mg total) by mouth daily. 05/13/23  Yes Levora Reyes SAUNDERS, MD  ibuprofen  (ADVIL ) 200 MG tablet Take 400 mg by mouth every 6 (six) hours as needed.   Yes [provider]  metoprolol  succinate (TOPROL -XL) 50 MG 24 hr tablet Take one tablet by mouth daily with or immediately following a meal. 11/08/23  Yes Levora Reyes SAUNDERS, MD  Prasterone  (INTRAROSA ) 6.5 MG INST Place 1 suppository vaginally at bedtime as needed. 05/06/23  Yes Glennon Almarie POUR, MD  rosuvastatin  (CRESTOR ) 10 MG tablet Take 1 tablet (10 mg total) by mouth daily. 10/04/23 01/15/24 Yes Kate Lonni CROME, MD  traZODone (DESYREL) 50 MG tablet Take 1-2 tablets by mouth at bedtime as needed for sleep.   Yes [provider]  UPNEEQ 0.1 % SOLN Apply 1 drop to eye daily. 04/08/23  Yes [provider]  ZEPBOUND  5 MG/0.5ML injection vial INJECT 0.5 ML (5 MG) UNDER THE SKIN ONCE WEEKLY (0.5ML= 50 UNITS) 12/19/23  Yes Levora Reyes SAUNDERS, MD   Social History   Socioeconomic History   Marital status: Married    Spouse name: Not on file  Number of children: Not on file   Years of education: Not on file   Highest education level: Not on file  Occupational History   Not on file  Tobacco Use   Smoking status: Never    Passive exposure: Never   Smokeless tobacco: Never  Vaping Use   Vaping status: Never Used  Substance and Sexual Activity   Alcohol use: No   Drug use: Never   Sexual activity: Not Currently    Partners: Male    Birth control/protection: Surgical    Comment: menarche 58yo, sexual debut 58yo, hysterectomy  Other Topics Concern   Not on file  Social History Narrative   Not on file   Social Drivers of Health   Financial Resource Strain: Patient Declined (10/24/2022)   Received from [facility]   Overall Financial Resource Strain (CARDIA)    Difficulty of Paying Living Expenses: Patient declined  Food Insecurity: Patient Declined (10/24/2022)   Received from [facility]   Hunger Vital Sign    Within the past 12 months, you worried that your food would run out before you got the money to buy more.: Patient declined    Within the past 12 months,  the food you bought just didn't last and you didn't have money to get more.: Patient declined  Transportation Needs: Patient Declined (10/24/2022)   Received from [facility] - Transportation    Lack of Transportation (Medical): Patient declined    Lack of Transportation (Non-Medical): Patient declined  Physical Activity: Not on file  Stress: Not on file  Social Connections: Unknown (09/28/2022)   Received from [facility]   Social Network    Social Network: Not on file  Intimate Partner Violence: Unknown (09/28/2022)   Received from [facility]   HITS    Physically Hurt: Not on file    Insult or Talk Down To: Not on file    Threaten Physical Harm: Not on file    Scream or Curse: Not on file    Review of Systems Per HPI  Objective:   Vitals:   01/15/24 1006  BP: 104/72  Pulse: 83  Resp: 18  Temp: 97.9 F (36.6 C)  TempSrc: Temporal  SpO2: 97%  Weight: 121 lb (54.9 kg)  Height: 5' (1.524 m)     Physical Exam Vitals reviewed.  Constitutional:      Appearance: Normal appearance. She is well-developed.  HENT:     Head: Normocephalic and atraumatic.  Eyes:     Conjunctiva/sclera: Conjunctivae normal.     Pupils: Pupils are equal, round, and reactive to light.  Neck:     Vascular: No carotid bruit.  Cardiovascular:     Rate and Rhythm: Normal rate and regular rhythm.     Heart sounds: Normal heart sounds.  Pulmonary:     Effort: Pulmonary effort is normal.     Breath sounds: Normal breath sounds.  Abdominal:     Palpations: Abdomen is soft. There is no pulsatile mass.     Tenderness: There is no abdominal tenderness.  Musculoskeletal:     Right lower leg: No edema.     Left lower leg: No edema.  Skin:    General: Skin is warm and dry.  Neurological:     Mental Status: She is alert and oriented to person, place, and time.  Psychiatric:        Mood and Affect: Mood normal.        Behavior: Behavior normal.  Assessment & Plan:   Willie Loy is a 58 y.o. female . Prediabetes - Plan: Hemoglobin A1c, tirzepatide  (ZEPBOUND ) 5 MG/0.5ML injection vial BMI 26.0-26.9,adult - Plan: tirzepatide  (ZEPBOUND ) 5 MG/0.5ML injection vial  - Check labs, treatment for side effects with zepbound  discussed including fiber supplement.  Option of lower dosing but will hold course for now.  RTC precautions.  Screen for colon cancer - Plan: Cologuard  Palpitation Primary hypertension - Plan: hydrochlorothiazide  (HYDRODIURIL ) 25 MG tablet, metoprolol  succinate (TOPROL -XL) 50 MG 24 hr tablet  - Stable with current regimen, continue same dose meds.  RTC precautions if new/worsening symptoms.  Hyperlipidemia, unspecified hyperlipidemia type - Plan: Comprehensive metabolic panel with GFR, Lipid panel  - Tolerating new start of Crestor , check updated labs with this regimen and adjust plan accordingly.   Meds ordered this encounter  Medications   hydrochlorothiazide  (HYDRODIURIL ) 25 MG tablet    Sig: Take 1 tablet (25 mg total) by mouth daily.    Dispense:  90 tablet    Refill:  1   metoprolol  succinate (TOPROL -XL) 50 MG 24 hr tablet    Sig: Take one tablet by mouth daily with or immediately following a meal.    Dispense:  90 tablet    Refill:  0   tirzepatide  (ZEPBOUND ) 5 MG/0.5ML injection vial    Sig: Inject 5 mg into the skin once a week. INJECT 0.5 ML (5 MG) UNDER THE SKIN ONCE WEEKLY (0.5ML= 50 UNITS)    Dispense:  2 mL    Refill:  5    Please send refills for Zepbound  Vials if applicable.   Patient Instructions  Fiber supplement like citrucel once per day. Colace as stool softener if needed.  I would consider a lower dose of Zeobound - let me know when you are ready to try that lower dose.  Thank you for coming in today. No change in medications at this time. If there are any concerns on your bloodwork, I will let you know. Take care!       Signed,   Reyes Pines, MD [facility], [facility] 01/15/24 10:42 AM

## 2024-01-15 NOTE — Patient Instructions (Addendum)
 Fiber supplement like citrucel once per day. Colace as stool softener if needed.  I would consider a lower dose of Zeobound - let me know when you are ready to try that lower dose.  Thank you for coming in today. No change in medications at this time. If there are any concerns on your bloodwork, I will let you know. Take care!

## 2024-01-21 ENCOUNTER — Ambulatory Visit: Payer: Self-pay | Admitting: Family Medicine

## 2024-01-21 DIAGNOSIS — E876 Hypokalemia: Secondary | ICD-10-CM

## 2024-01-21 MED ORDER — POTASSIUM CHLORIDE CRYS ER 10 MEQ PO TBCR: 10.0000 meq | EXTENDED_RELEASE_TABLET | Freq: Every day | ORAL | 3 refills | Status: AC

## 2024-01-22 NOTE — Telephone Encounter (Signed)
 Called patient and left vm to relay lab results.  Please relay labs results when patient calls back and schedule lab visit  Future labs ordered

## 2024-01-25 LAB — COLOGUARD: COLOGUARD: NEGATIVE

## 2024-01-31 ENCOUNTER — Ambulatory Visit

## 2024-01-31 ENCOUNTER — Other Ambulatory Visit (INDEPENDENT_AMBULATORY_CARE_PROVIDER_SITE_OTHER)

## 2024-01-31 DIAGNOSIS — E876 Hypokalemia: Secondary | ICD-10-CM | POA: Diagnosis not present

## 2024-01-31 LAB — BASIC METABOLIC PANEL WITH GFR
BUN: 14 mg/dL (ref 6–23)
CO2: 32 meq/L (ref 19–32)
Calcium: 9.4 mg/dL (ref 8.4–10.5)
Chloride: 98 meq/L (ref 96–112)
Creatinine, Ser: 0.91 mg/dL (ref 0.40–1.20)
GFR: 70.68 mL/min
Glucose, Bld: 98 mg/dL (ref 70–99)
Potassium: 3.7 meq/L (ref 3.5–5.1)
Sodium: 138 meq/L (ref 135–145)

## 2024-01-31 NOTE — Addendum Note (Signed)
 Addended by: TANDA HARVEY D on: 01/31/2024 01:09 PM   Modules accepted: Orders

## 2024-02-02 ENCOUNTER — Ambulatory Visit: Payer: Self-pay | Admitting: Family Medicine

## 2024-03-17 IMAGING — DX DG FINGER LITTLE 2+V*R*
3 series · 3 of 3 positions shown · non-contrast
Comparison: From earlier in the same day.

CLINICAL DATA: Status post reduction

EXAM:
RIGHT LITTLE FINGER 2+V

[finger ap]
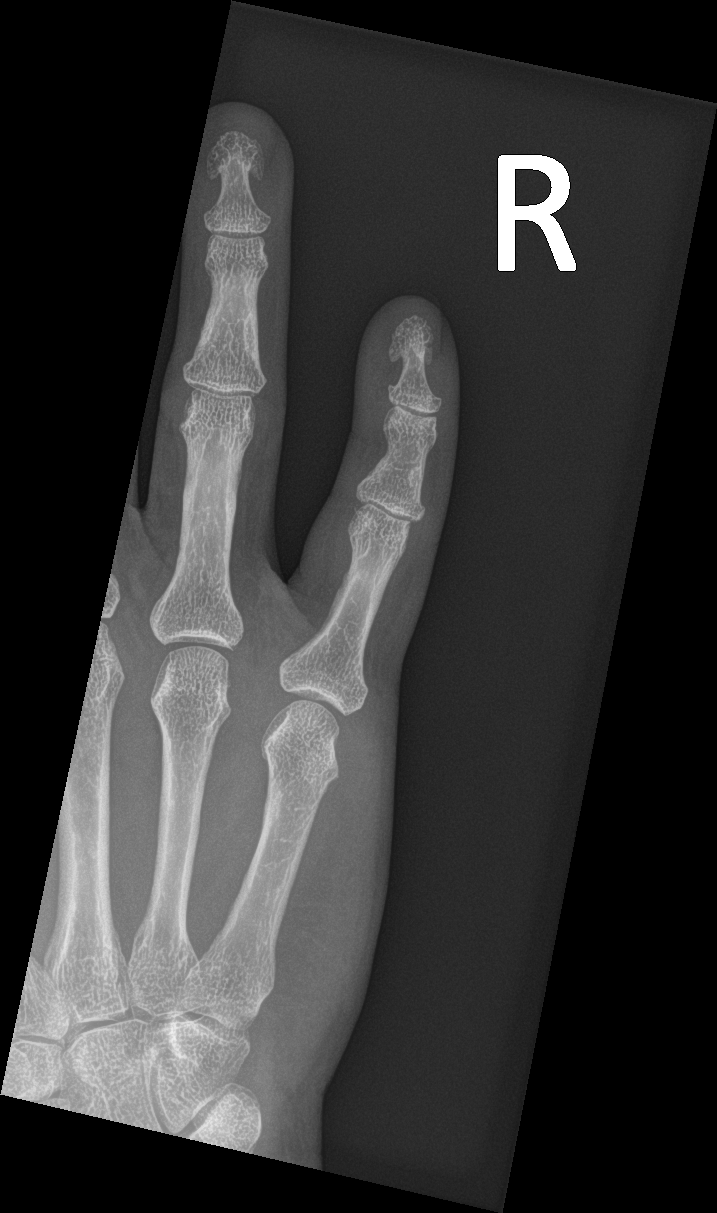

[finger obl]
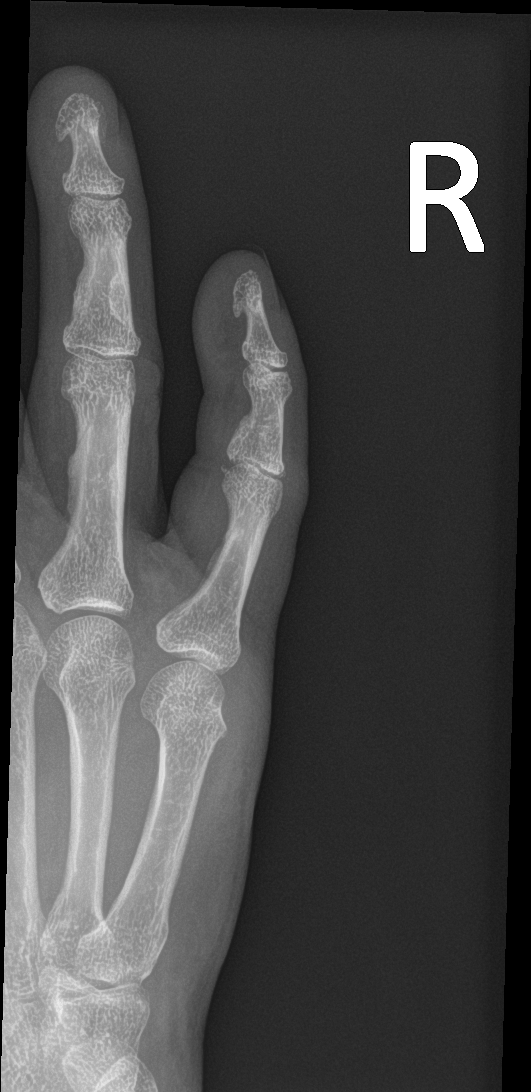

[finger lat]
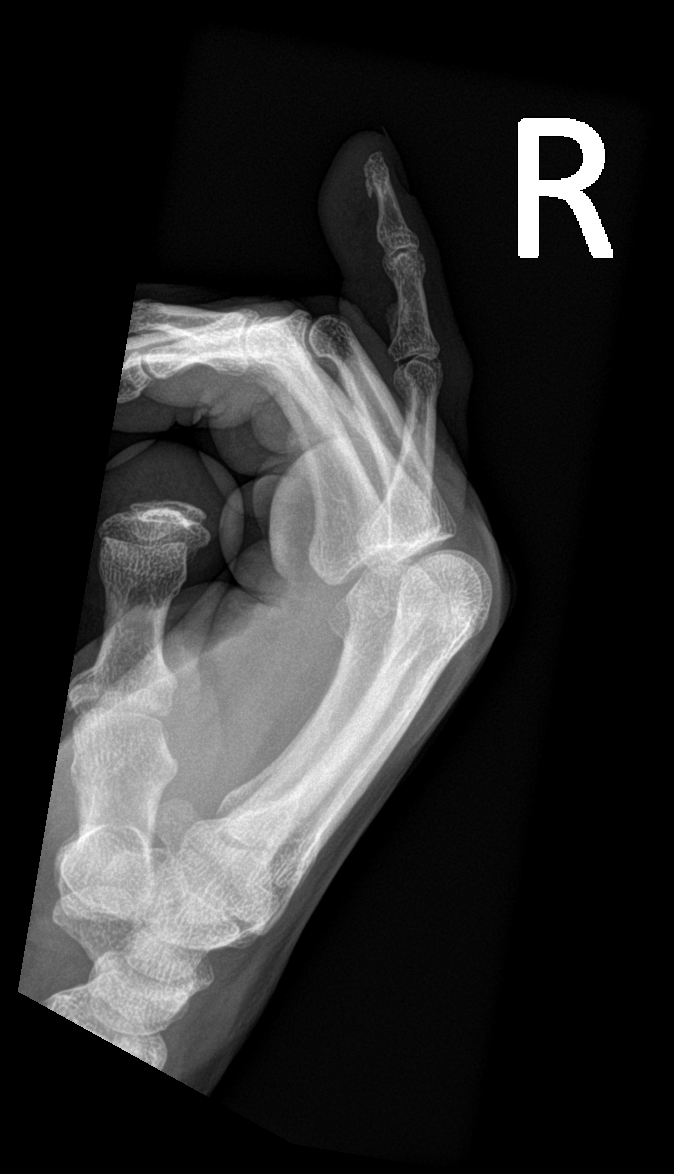

[3 of 3 positions shown; findings below may reference images not displayed]

FINDINGS: There is been interval reduction of the PIP dislocation in the fifth
digit. Tiny fragment is noted adjacent to the PIP joint laterally
consistent with small avulsion from the base of the middle phalanx.
IMPRESSION: Status post reduction.

Small avulsion is noted from the base of the middle phalanx.

## 2024-07-15 ENCOUNTER — Ambulatory Visit: Admitting: Family Medicine
